# Patient Record
Sex: Male | Born: 1970
Health system: Southern US, Community
[De-identification: ages and names within clinical notes are randomized; demographics above are authoritative.]

## PROBLEM LIST (undated history)

## (undated) HISTORY — PX: HERNIA REPAIR: SHX51

---

## 1997-07-27 ENCOUNTER — Encounter: Admission: RE | Admit: 1997-07-27 | Discharge: 1997-07-27 | Payer: Self-pay | Admitting: Family Medicine

## 1999-07-28 ENCOUNTER — Emergency Department (HOSPITAL_COMMUNITY): Admission: EM | Admit: 1999-07-28 | Discharge: 1999-07-28 | Payer: Self-pay | Admitting: Emergency Medicine

## 1999-08-14 ENCOUNTER — Encounter: Admission: RE | Admit: 1999-08-14 | Discharge: 1999-08-14 | Payer: Self-pay | Admitting: Family Medicine

## 2000-07-09 ENCOUNTER — Encounter: Admission: RE | Admit: 2000-07-09 | Discharge: 2000-07-09 | Payer: Self-pay | Admitting: Sports Medicine

## 2000-07-31 ENCOUNTER — Encounter: Admission: RE | Admit: 2000-07-31 | Discharge: 2000-07-31 | Payer: Self-pay | Admitting: *Deleted

## 2001-06-10 ENCOUNTER — Encounter: Admission: RE | Admit: 2001-06-10 | Discharge: 2001-06-10 | Payer: Self-pay | Admitting: Family Medicine

## 2001-06-30 ENCOUNTER — Encounter: Admission: RE | Admit: 2001-06-30 | Discharge: 2001-06-30 | Payer: Self-pay | Admitting: Family Medicine

## 2001-08-16 ENCOUNTER — Ambulatory Visit (HOSPITAL_COMMUNITY): Admission: RE | Admit: 2001-08-16 | Discharge: 2001-08-16 | Payer: Self-pay | Admitting: Neurology

## 2001-08-16 ENCOUNTER — Encounter: Payer: Self-pay | Admitting: Neurology

## 2001-10-03 ENCOUNTER — Encounter: Admission: RE | Admit: 2001-10-03 | Discharge: 2001-10-03 | Payer: Self-pay | Admitting: Family Medicine

## 2003-01-17 ENCOUNTER — Emergency Department (HOSPITAL_COMMUNITY): Admission: AD | Admit: 2003-01-17 | Discharge: 2003-01-18 | Payer: Self-pay | Admitting: Emergency Medicine

## 2003-05-27 ENCOUNTER — Emergency Department (HOSPITAL_COMMUNITY): Admission: EM | Admit: 2003-05-27 | Discharge: 2003-05-27 | Payer: Self-pay | Admitting: Family Medicine

## 2007-12-18 ENCOUNTER — Emergency Department (HOSPITAL_COMMUNITY): Admission: EM | Admit: 2007-12-18 | Discharge: 2007-12-18 | Payer: Self-pay | Admitting: Family Medicine

## 2009-03-23 ENCOUNTER — Emergency Department (HOSPITAL_COMMUNITY): Admission: EM | Admit: 2009-03-23 | Discharge: 2009-03-23 | Payer: Self-pay | Admitting: Family Medicine

## 2012-08-30 ENCOUNTER — Emergency Department (HOSPITAL_COMMUNITY)
Admission: EM | Admit: 2012-08-30 | Discharge: 2012-08-30 | Disposition: A | Discharge status: Home / Self Care | Payer: Self-pay | Source: Home / Self Care | Attending: Family Medicine | Admitting: Family Medicine

## 2012-08-30 ENCOUNTER — Encounter (HOSPITAL_COMMUNITY): Payer: Self-pay | Admitting: *Deleted

## 2012-08-30 DIAGNOSIS — B351 Tinea unguium: Secondary | ICD-10-CM

## 2012-08-30 DIAGNOSIS — L600 Ingrowing nail: Secondary | ICD-10-CM

## 2012-08-30 MED ORDER — HYDROCODONE-ACETAMINOPHEN 5-325 MG PO TABS: 1.0000 | ORAL_TABLET | Freq: Four times a day (QID) | ORAL | Status: DC | PRN

## 2012-08-30 NOTE — ED Notes (Signed)
Pt  Reports  Ingrown     r  Toenail for  sev  Months    He  Reports  He  Has  Been   Using nail  Clippers    Trying  To   Fix the  Problem

## 2012-08-30 NOTE — ED Provider Notes (Signed)
History     CSN: [id number]  Arrival date & time 08/30/12  1150   First MD Initiated Contact with Patient 08/30/12 1246      Chief Complaint  Patient presents with  . Toe Pain    (Consider location/radiation/quality/duration/timing/severity/associated sxs/prior treatment) Patient is a 42 y.o. male presenting with toe pain. The history is provided by the patient.  Toe Pain This is a new problem. The current episode started more than 1 week ago (pt and wife have been cutting on nail for sev mos, sx getting worse.). The problem has been gradually worsening. The symptoms are aggravated by walking.    History reviewed. No pertinent past medical history.  History reviewed. No pertinent past surgical history.  History reviewed. No pertinent family history.  History  Substance Use Topics  . Smoking status: Never Smoker   . Smokeless tobacco: Not on file  . Alcohol Use: No      Review of Systems  Constitutional: Negative.   Musculoskeletal: Positive for gait problem.    Allergies  Review of patient's allergies indicates no known allergies.  Home Medications   Current Outpatient Rx  Name  Route  Sig  Dispense  Refill  . HYDROcodone-acetaminophen (NORCO/VICODIN) 5-325 MG per tablet   Oral   Take 1 tablet by mouth every 6 (six) hours as needed for pain.   10 tablet   0     BP 140/78  Pulse 72  Temp(Src) 98.6 F (37 C) (Oral)  Resp 16  SpO2 100%  Physical Exam  Nursing note and vitals reviewed. Constitutional: He is oriented to person, place, and time. He appears well-developed and well-nourished.  Musculoskeletal: He exhibits tenderness.       Feet:  Neurological: He is alert and oriented to person, place, and time.  Skin: Skin is warm and dry.    ED Course  NAIL REMOVAL Date/Time: 08/30/2012 1:08 PM Performed by: Linna Hoff Authorized by: Bradd Canary D Consent: Verbal consent obtained. Consent given by: patient Location: right foot Location  details: right big toe Anesthesia: local infiltration Local anesthetic: lidocaine 2% without epinephrine Patient sedated: no Preparation: skin prepped with Betadine Amount removed: complete Nail matrix removed: partial Removed nail replaced and anchored: no Dressing: Xeroform gauze Patient tolerance: Patient tolerated the procedure well with no immediate complications. Comments: Granuloma debrided.   (including critical care time)  Labs Reviewed - No data to display No results found.   1. Onychomycosis with ingrown toenail       MDM  Toenail removed        Linna Hoff, MD 08/30/12 1315

## 2012-09-01 NOTE — ED Notes (Signed)
Chart review.

## 2013-10-07 ENCOUNTER — Encounter (HOSPITAL_COMMUNITY): Payer: Self-pay | Admitting: Emergency Medicine

## 2013-10-07 ENCOUNTER — Emergency Department (HOSPITAL_COMMUNITY)
Admission: EM | Admit: 2013-10-07 | Discharge: 2013-10-07 | Disposition: A | Discharge status: Home / Self Care | Payer: BC Managed Care – PPO | Source: Home / Self Care | Attending: Family Medicine | Admitting: Family Medicine

## 2013-10-07 DIAGNOSIS — Z23 Encounter for immunization: Secondary | ICD-10-CM

## 2013-10-07 DIAGNOSIS — L600 Ingrowing nail: Secondary | ICD-10-CM

## 2013-10-07 MED ORDER — TETANUS-DIPHTH-ACELL PERTUSSIS 5-2.5-18.5 LF-MCG/0.5 IM SUSP
INTRAMUSCULAR | Status: AC
  Filled 2013-10-07: qty 0.5

## 2013-10-07 MED ORDER — HYDROCODONE-ACETAMINOPHEN 5-325 MG PO TABS: 1.0000 | ORAL_TABLET | Freq: Four times a day (QID) | ORAL | Status: DC | PRN

## 2013-10-07 MED ORDER — TETANUS-DIPHTH-ACELL PERTUSSIS 5-2.5-18.5 LF-MCG/0.5 IM SUSP
0.5000 mL | Freq: Once | INTRAMUSCULAR | Status: AC
  Administered 2013-10-07: 0.5 mL via INTRAMUSCULAR

## 2013-10-07 MED ORDER — CEPHALEXIN 500 MG PO CAPS: 500.0000 mg | ORAL_CAPSULE | Freq: Three times a day (TID) | ORAL | Status: DC

## 2013-10-07 NOTE — ED Provider Notes (Signed)
CSN: [id number]     Arrival date & time 10/07/13  1618 History   First MD Initiated Contact with Patient 10/07/13 1635     Chief Complaint  Patient presents with  . Toe Pain   (Consider location/radiation/quality/duration/timing/severity/associated sxs/prior Treatment) HPI Comments: 43 year old male presents complaining of ingrown toenail on the medial portion of the right great toe. He has had problems with this in the past, he had the entire nail removed about a year ago due to onychomycosis With ingrown nail. for the past couple of months he has been having worsening pain from an ingrown nail. He also notes that he has been wearing his steel toed boots frequently and wonders if that may be contributing. Pain in the toe is mild to moderate. No other complaints  Patient is a 43 y.o. male presenting with toe pain.  Toe Pain Pertinent negatives include no chest pain, no abdominal pain and no shortness of breath.    History reviewed. No pertinent past medical history. History reviewed. No pertinent past surgical history. History reviewed. No pertinent family history. History  Substance Use Topics  . Smoking status: Never Smoker   . Smokeless tobacco: Not on file  . Alcohol Use: No    Review of Systems  Constitutional: Negative for fever, chills and fatigue.  HENT: Negative for sore throat.   Eyes: Negative for visual disturbance.  Respiratory: Negative for cough and shortness of breath.   Cardiovascular: Negative for chest pain, palpitations and leg swelling.  Gastrointestinal: Negative for nausea, vomiting, abdominal pain, diarrhea and constipation.  Genitourinary: Negative for dysuria, urgency, frequency and hematuria.  Musculoskeletal: Positive for gait problem. Negative for arthralgias, myalgias, neck pain and neck stiffness.       See history of present illness regarding ingrown toenail-medial right great toe  Skin: Negative for rash.  Neurological: Negative for dizziness,  weakness and light-headedness.  All other systems reviewed and are negative.   Allergies  Review of patient's allergies indicates no known allergies.  Home Medications   Prior to Admission medications   Medication Sig Start Date End Date Taking? Authorizing Provider  cephALEXin (KEFLEX) 500 MG capsule Take 1 capsule (500 mg total) by mouth 3 (three) times daily. 10/07/13   Graylon Good, PA-C  HYDROcodone-acetaminophen (NORCO) 5-325 MG per tablet Take 1 tablet by mouth every 6 (six) hours as needed for moderate pain. 10/07/13   Graylon Good, PA-C  HYDROcodone-acetaminophen (NORCO/VICODIN) 5-325 MG per tablet Take 1 tablet by mouth every 6 (six) hours as needed for pain. 08/30/12   Linna Hoff, MD   BP 134/89  Pulse 78  Temp(Src) 98.8 F (37.1 C) (Oral)  Resp 16  Ht 6\' 1"  (1.854 m)  Wt 235 lb (106.595 kg)  BMI 31.01 kg/m2  SpO2 98% Physical Exam  Nursing note and vitals reviewed. Constitutional: He is oriented to person, place, and time. He appears well-developed and well-nourished. No distress.  HENT:  Head: Normocephalic.  Pulmonary/Chest: Effort normal. No respiratory distress.  Musculoskeletal:       Feet:  Neurological: He is alert and oriented to person, place, and time. Coordination normal.  Skin: Skin is warm and dry. No rash noted. He is not diaphoretic.  Psychiatric: He has a normal mood and affect. Judgment normal.    ED Course  NAIL REMOVAL Date/Time: 10/07/2013 8:17 PM Performed by: Autumn Messing, H Authorized by: Autumn Messing, H Risks and benefits: risks, benefits and alternatives were discussed Consent given by: patient Patient understanding: patient  states understanding of the procedure being performed Patient identity confirmed: verbally with patient Time out: Immediately prior to procedure a "time out" was called to verify the correct patient, procedure, equipment, support staff and site/side marked as required. Location: right foot Location  details: right big toe Anesthesia: digital block Local anesthetic: bupivacaine 0.5% without epinephrine Anesthetic total: 10 ml Preparation: skin prepped with Betadine Amount removed: partial Side: radial Wedge excision of skin of nail fold: no Nail bed sutured: no Nail matrix removed: partial Removed nail replaced and anchored: no Dressing: antibiotic ointment and tube gauze Patient tolerance: Patient tolerated the procedure well with no immediate complications. Comments: The ingrown portion of the nail extended about 1 cm past where he had his nail cut, deep into the nail fold   (including critical care time) Labs Review Labs Reviewed - No data to display  Imaging Review No results found.   MDM   1. Ingrown toenail    The nail was excised, bleeding controlled with silver nitrate cautery and direct pressure. Patient will use a postop shoe and stay off the foot for couple days. Keflex for infection prophylaxis, Norco for pain. Followup with podiatry if this recurs.  Meds ordered this encounter  Medications  . Tdap (BOOSTRIX) injection 0.5 mL    Sig:   . HYDROcodone-acetaminophen (NORCO) 5-325 MG per tablet    Sig: Take 1 tablet by mouth every 6 (six) hours as needed for moderate pain.    Dispense:  30 tablet    Refill:  0    Order Specific Question:  Supervising Provider    Answer:  MERRELL, DAVID J [[id number]]  . cephALEXin (KEFLEX) 500 MG capsule    Sig: Take 1 capsule (500 mg total) by mouth 3 (three) times daily.    Dispense:  21 capsule    Refill:  0    Order Specific Question:  Supervising Provider    Answer:  MERRELL, DAVID J [[id number]]     Graylon GoodZachary H Estiben Mizuno, PA-C 10/07/13 2020

## 2013-10-07 NOTE — ED Notes (Signed)
Pt  Reports     painfull  Ingrown toenail             l  Big  Toe           Has  Had  Problem     With that toe  In  Past

## 2013-10-07 NOTE — Discharge Instructions (Signed)
Ingrown Toenail An ingrown toenail occurs when the sharp edge of your toenail grows into the skin. Causes of ingrown toenails include toenails clipped too far back or poorly fitting shoes. Activities involving sudden stops (basketball, tennis) causing "toe jamming" may lead to an ingrown nail. HOME CARE INSTRUCTIONS   Soak the whole foot in warm soapy water for 20 minutes, 3 times per day.  You may lift the edge of the nail away from the sore skin by wedging a small piece of cotton under the corner of the nail. Be careful not to dig (traumatize) and cause more injury to the area.  Wear shoes that fit well. While the ingrown nail is causing problems, sandals may be beneficial.  Trim your toenails regularly and carefully. Cut your toenails straight across, not in a curve. This will prevent injury to the skin at the corners of the toenail.  Keep your feet clean and dry.  Crutches may be helpful early in treatment if walking is painful.  Antibiotics, if prescribed, should be taken as directed.  Return for a wound check in 2 days or as directed.  Only take over-the-counter or prescription medicines for pain, discomfort, or fever as directed by your caregiver. SEEK IMMEDIATE MEDICAL CARE IF:   You have a fever.  You have increasing pain, redness, swelling, or heat at the wound site.  Your toe is not better in 7 days. If conservative treatment is not successful, surgical removal of a portion or all of the nail may be necessary. MAKE SURE YOU:   Understand these instructions.  Will watch your condition.  Will get help right away if you are not doing well or get worse. Document Released: 02/24/2000 Document Revised: 05/21/2011 Document Reviewed: 02/18/2008 [facility] Patient Information 2015 [place], [place]. This information is not intended to replace advice given to you by your health care provider. Make sure you discuss any questions you have with your health care provider.  Toenail  Removal Toenails may need to be removed because of injury, infections, or to correct abnormal growth. A special non-stick bandage will likely be put tightly on your toe to prevent bleeding. Often times a new nail will grow back. Sometimes the new nail may be deformed. Most of the time when a nail is lost, it will gradually heal, but may be sensitive for a long time. HOME CARE INSTRUCTIONS   Keep your foot elevated to relieve pain and swelling. This will require lying in bed or on a couch with the leg on pillows or sitting in a recliner with the leg up. Walking or letting your leg dangle may increase swelling, slow healing, and cause throbbing pain.  Keep your bandage dry and clean.  Change your bandage in 24 hours.  After your bandage is changed, soak your foot in warm, soapy water for 10 to 20 minutes. Do this 3 times per day. This helps reduce pain and swelling. After soaking your foot apply a clean, dry bandage. Change your bandage if it is wet or dirty.  Only take over-the-counter or prescription medicines for pain, discomfort, or fever as directed by your caregiver.  See your caregiver as needed for problems. You might need a tetanus shot now if:  You have no idea when you had the last one.  You have never had a tetanus shot before.  The injured area had dirt in it. If you need a tetanus shot, and you decide not to get one, there is a rare chance of getting tetanus.  Sickness from tetanus can be serious. If you did get a tetanus shot, your arm may swell, get red and warm to the touch at the shot site. This is common and not a problem. SEEK IMMEDIATE MEDICAL CARE IF:   You have increased pain, swelling, redness, warmth, drainage, or bleeding.  You have a fever.  You have swelling that spreads from your toe into your foot. Document Released: 11/25/2002 Document Revised: 05/21/2011 Document Reviewed: 03/08/2008 [facility]Exit[facility] Patient Information 2015 [place]ExitCare, [place]LL[place]. This information is not  intended to replace advice given to you by your health care provider. Make sure you discuss any questions you have with your health care provider.

## 2013-10-07 NOTE — ED Provider Notes (Signed)
Medical screening examination/treatment/procedure(s) were performed by resident physician or non-physician practitioner and as supervising physician I was immediately available for consultation/collaboration.   Barkley BrunsKINDL,Lile Mccurley DOUGLAS MD.   Linna HoffJames D Nautika Cressey, MD 10/07/13 2033

## 2013-10-07 NOTE — ED Notes (Signed)
Pt. Declined wooden shoe.  States he already has one in the car.  Family member went to get it.  PA notified.

## 2013-10-07 NOTE — ED Notes (Signed)
Pt states he has is own post op shoe

## 2013-12-23 ENCOUNTER — Encounter (HOSPITAL_COMMUNITY): Payer: Self-pay | Admitting: Emergency Medicine

## 2013-12-23 ENCOUNTER — Emergency Department (HOSPITAL_COMMUNITY)
Admission: EM | Admit: 2013-12-23 | Discharge: 2013-12-23 | Disposition: A | Discharge status: Home / Self Care | Payer: BC Managed Care – PPO | Source: Home / Self Care | Attending: Emergency Medicine | Admitting: Emergency Medicine

## 2013-12-23 DIAGNOSIS — J0110 Acute frontal sinusitis, unspecified: Secondary | ICD-10-CM

## 2013-12-23 DIAGNOSIS — G43009 Migraine without aura, not intractable, without status migrainosus: Secondary | ICD-10-CM

## 2013-12-23 MED ORDER — METHYLPREDNISOLONE ACETATE 80 MG/ML IJ SUSP
INTRAMUSCULAR | Status: AC
  Filled 2013-12-23: qty 1

## 2013-12-23 MED ORDER — KETOROLAC TROMETHAMINE 60 MG/2ML IM SOLN
INTRAMUSCULAR | Status: AC
  Filled 2013-12-23: qty 2

## 2013-12-23 MED ORDER — METOCLOPRAMIDE HCL 5 MG/ML IJ SOLN
10.0000 mg | Freq: Once | INTRAMUSCULAR | Status: AC
  Administered 2013-12-23: 10 mg via INTRAMUSCULAR

## 2013-12-23 MED ORDER — AMOXICILLIN-POT CLAVULANATE 875-125 MG PO TABS: 1.0000 | ORAL_TABLET | Freq: Two times a day (BID) | ORAL | Status: DC

## 2013-12-23 MED ORDER — METHYLPREDNISOLONE ACETATE 80 MG/ML IJ SUSP
80.0000 mg | Freq: Once | INTRAMUSCULAR | Status: AC
  Administered 2013-12-23: 80 mg via INTRAMUSCULAR

## 2013-12-23 MED ORDER — METOCLOPRAMIDE HCL 5 MG/ML IJ SOLN
INTRAMUSCULAR | Status: AC
  Filled 2013-12-23: qty 2

## 2013-12-23 MED ORDER — KETOROLAC TROMETHAMINE 60 MG/2ML IM SOLN
60.0000 mg | Freq: Once | INTRAMUSCULAR | Status: AC
  Administered 2013-12-23: 60 mg via INTRAMUSCULAR

## 2013-12-23 MED ORDER — FLUTICASONE PROPIONATE 50 MCG/ACT NA SUSP: 2.0000 | Freq: Every day | NASAL | Status: AC

## 2013-12-23 NOTE — ED Notes (Signed)
Pt  Reports      Symptoms  Of  Headache       X   5  Days               Sensitive  To  Light               Pain  In  Frontal        Region               No  History  Of any  Migraine       He  Is  Sitting  Upright on  The  Exam table  Speaking in complete  sentances

## 2013-12-23 NOTE — Discharge Instructions (Signed)
Migraine Headache A migraine headache is an intense, throbbing pain on one or both sides of your head. A migraine can last for 30 minutes to several hours. CAUSES  The exact cause of a migraine headache is not always known. However, a migraine may be caused when nerves in the brain become irritated and release chemicals that cause inflammation. This causes pain. Certain things may also trigger migraines, such as:  Alcohol.  Smoking.  Stress.  Menstruation.  Aged cheeses.  Foods or drinks that contain nitrates, glutamate, aspartame, or tyramine.  Lack of sleep.  Chocolate.  Caffeine.  Hunger.  Physical exertion.  Fatigue.  Medicines used to treat chest pain (nitroglycerine), birth control pills, estrogen, and some blood pressure medicines. SIGNS AND SYMPTOMS  Pain on one or both sides of your head.  Pulsating or throbbing pain.  Severe pain that prevents daily activities.  Pain that is aggravated by any physical activity.  Nausea, vomiting, or both.  Dizziness.  Pain with exposure to bright lights, loud noises, or activity.  General sensitivity to bright lights, loud noises, or smells. Before you get a migraine, you may get warning signs that a migraine is coming (aura). An aura may include:  Seeing flashing lights.  Seeing bright spots, halos, or zigzag lines.  Having tunnel vision or blurred vision.  Having feelings of numbness or tingling.  Having trouble talking.  Having muscle weakness. DIAGNOSIS  A migraine headache is often diagnosed based on:  Symptoms.  Physical exam.  A CT scan or MRI of your head. These imaging tests cannot diagnose migraines, but they can help rule out other causes of headaches. TREATMENT Medicines may be given for pain and nausea. Medicines can also be given to help prevent recurrent migraines.  HOME CARE INSTRUCTIONS  Only take over-the-counter or prescription medicines for pain or discomfort as directed by your  health care provider. The use of long-term narcotics is not recommended.  Lie down in a dark, quiet room when you have a migraine.  Keep a journal to find out what may trigger your migraine headaches. For example, write down:  What you eat and drink.  How much sleep you get.  Any change to your diet or medicines.  Limit alcohol consumption.  Quit smoking if you smoke.  Get 7-9 hours of sleep, or as recommended by your health care provider.  Limit stress.  Keep lights dim if bright lights bother you and make your migraines worse. SEEK IMMEDIATE MEDICAL CARE IF:   Your migraine becomes severe.  You have a fever.  You have a stiff neck.  You have vision loss.  You have muscular weakness or loss of muscle control.  You start losing your balance or have trouble walking.  You feel faint or pass out.  You have severe symptoms that are different from your first symptoms. MAKE SURE YOU:   Understand these instructions.  Will watch your condition.  Will get help right away if you are not doing well or get worse. Document Released: 02/26/2005 Document Revised: 07/13/2013 Document Reviewed: 11/03/2012 Exit[employer] Patient Information 2015 [employer]. This information is not intended to replace advice given to you by your health care provider. Make sure you discuss any questions you have with your health care provider.  Sinusitis Sinusitis is redness, soreness, and inflammation of the paranasal sinuses. Paranasal sinuses are air pockets within the bones of your face (beneath the eyes, the middle of the forehead, or above the eyes). In healthy paranasal sinuses, mucus   is able to drain out, and air is able to circulate through them by way of your nose. However, when your paranasal sinuses are inflamed, mucus and air can become trapped. This can allow bacteria and other germs to grow and cause infection. Sinusitis can develop quickly and last only a short time (acute) or continue  over a long period (chronic). Sinusitis that lasts for more than 12 weeks is considered chronic.  CAUSES  Causes of sinusitis include:  Allergies.  Structural abnormalities, such as displacement of the cartilage that separates your nostrils (deviated septum), which can decrease the air flow through your nose and sinuses and affect sinus drainage.  Functional abnormalities, such as when the small hairs (cilia) that line your sinuses and help remove mucus do not work properly or are not present. SIGNS AND SYMPTOMS  Symptoms of acute and chronic sinusitis are the same. The primary symptoms are pain and pressure around the affected sinuses. Other symptoms include:  Upper toothache.  Earache.  Headache.  Bad breath.  Decreased sense of smell and taste.  A cough, which worsens when you are lying flat.  Fatigue.  Fever.  Thick drainage from your nose, which often is green and may contain pus (purulent).  Swelling and warmth over the affected sinuses. DIAGNOSIS  Your health care provider will perform a physical exam. During the exam, your health care provider may:  Look in your nose for signs of abnormal growths in your nostrils (nasal polyps).  Tap over the affected sinus to check for signs of infection.  View the inside of your sinuses (endoscopy) using an imaging device that has a light attached (endoscope). If your health care provider suspects that you have chronic sinusitis, one or more of the following tests may be recommended:  Allergy tests.  Nasal culture. A sample of mucus is taken from your nose, sent to a lab, and screened for bacteria.  Nasal cytology. A sample of mucus is taken from your nose and examined by your health care provider to determine if your sinusitis is related to an allergy. TREATMENT  Most cases of acute sinusitis are related to a viral infection and will resolve on their own within 10 days. Sometimes medicines are prescribed to help relieve  symptoms (pain medicine, decongestants, nasal steroid sprays, or saline sprays).  However, for sinusitis related to a bacterial infection, your health care provider will prescribe antibiotic medicines. These are medicines that will help kill the bacteria causing the infection.  Rarely, sinusitis is caused by a fungal infection. In theses cases, your health care provider will prescribe antifungal medicine. For some cases of chronic sinusitis, surgery is needed. Generally, these are cases in which sinusitis recurs more than 3 times per year, despite other treatments. HOME CARE INSTRUCTIONS   Drink plenty of water. Water helps thin the mucus so your sinuses can drain more easily.  Use a humidifier.  Inhale steam 3 to 4 times a day (for example, sit in the bathroom with the shower running).  Apply a warm, moist washcloth to your face 3 to 4 times a day, or as directed by your health care provider.  Use saline nasal sprays to help moisten and clean your sinuses.  Take medicines only as directed by your health care provider.  If you were prescribed either an antibiotic or antifungal medicine, finish it all even if you start to feel better. SEEK IMMEDIATE MEDICAL CARE IF:  You have increasing pain or severe headaches.  You have nausea, vomiting,   or drowsiness.  You have swelling around your face.  You have vision problems.  You have a stiff neck.  You have difficulty breathing. MAKE SURE YOU:   Understand these instructions.  Will watch your condition.  Will get help right away if you are not doing well or get worse. Document Released: 02/26/2005 Document Revised: 07/13/2013 Document Reviewed: 03/13/2011 Exit[employer] Patient Information 2015 [employer], [employer]. This information is not intended to replace advice given to you by your health care provider. Make sure you discuss any questions you have with your health care provider.  

## 2013-12-23 NOTE — ED Provider Notes (Signed)
Chief Complaint   Headache   History of Present Illness   Victor Ingram is a 43 year old male who has had a five-day history of a bifrontal headache. This began this past Saturday morning spontaneously. The headaches reach maximum intensity after about an hour. They wax and wane thereafter. He had good relief with Goody's powder and extra strength Tylenol. The pain to be worse with activity and work. The pain was a frontal and throbbing associated with nausea but no vomiting, photophobia, and phonophobia. He also had nasal congestion with yellowish drainage. He denied any diplopia, blurry vision, or visual auras. He had no fever, chills, sore throat, adenopathy, or stiff neck. He denies any paresthesias, muscle weakness, difficulty with speech, ambulation, or dizziness. He denies any head trauma, use of anticoagulants, eye pain, neck injury, jaw claudication, or history of blood clots.  Review of Systems   Other than as noted above, the patient denies any of the following symptoms: Systemic:  No fever, chills, photophobia, or stiff neck. Eye:  No blurred vision, or diplopia. ENT:  No nasal congestion, rhinorrhea, sinus pressure or pain, or sore throat.  No jaw claudication. Neuro:  No paresthesias, loss of consciousness, seizure activity, muscle weakness, trouble with coordination or gait, trouble speaking or swallowing. Psych:  No depression, anxiety or trouble sleeping.  PMFSH   Past medical history, family history, social history, meds, and allergies were reviewed.    Physical Examination    Vital signs:  BP 146/95  Pulse 76  Temp(Src) 99.7 F (37.6 C) (Oral)  Resp 16  Ht 6\' 1"  (1.854 m)  Wt 230 lb (104.327 kg)  BMI 30.35 kg/m2  SpO2 100% General:  Alert and oriented.  In no distress. Eye:  Lids and conjunctivas normal.  PERRL,  Full EOMs.  Fundi benign with normal discs and vessels. There was no papilledema.  ENT:  No cranial or facial tenderness to palpation.  TMs and  canals clear.  Nasal mucosa was normal and uncongested without any drainage. No intra oral lesions, pharynx clear, mucous membranes moist, dentition normal. Neck:  Supple, full ROM, no tenderness to palpation.  No adenopathy or mass. Neuro:  Alert and orented times 3.  Speech was clear, fluent, and appropriate.  Cranial nerves intact. No pronator drift, muscle strength normal. Finger to nose normal.  DTRs were 2+ and symmetrical.Station and gait were normal.  Romberg's sign was normal.  Able to perform tandem gait well. Psych:  Normal affect.  Course in [facility]   The following medications were given:  Medications  ketorolac (TORADOL) injection 60 mg (60 mg Intramuscular Given 12/23/13 1733)  methylPREDNISolone acetate (DEPO-MEDROL) injection 80 mg (80 mg Intramuscular Given 12/23/13 1733)  metoCLOPramide (REGLAN) injection 10 mg (10 mg Intramuscular Given 12/23/13 1733)   Assessment   The primary encounter diagnosis was Migraine without aura and without status migrainosus, not intractable. A diagnosis of Acute frontal sinusitis, recurrence not specified was also pertinent to this visit.  There is no evidence of subarachnoid hemorrhage, meningitis, encephalitis, subdural hematoma, epidural hematoma, acute glaucoma, carotid dissection, temporal arteritis, cavernous sinus thrombosis, carbon monoxide toxicity, or pseudotumor cerebri.    I think this is a migraine type headache brought on by an acute sinus infection.  Plan   1.  Meds:  The following meds were prescribed:   New Prescriptions   AMOXICILLIN-CLAVULANATE (AUGMENTIN) 875-125 MG PER TABLET    Take 1 tablet by mouth 2 (two) times daily.   FLUTICASONE (FLONASE) 50 MCG/ACT  NASAL SPRAY    Place 2 sprays into both nostrils daily.    2.  Patient Education/Counseling:  The patient was given appropriate handouts, self care instructions, and instructed in pain control.    3.  Follow up:  The patient was told to follow up here  if no better in 2 to 3 days, or sooner if becoming worse in any way, and given some red flag symptoms such as fever, worsening pain, persistent vomiting, or new neurological symptoms which would prompt immediate return.       Victor Likesavid C Solae Norling, MD 12/23/13 [phone]

## 2014-07-26 ENCOUNTER — Encounter (HOSPITAL_COMMUNITY): Payer: Self-pay

## 2014-07-26 ENCOUNTER — Emergency Department (INDEPENDENT_AMBULATORY_CARE_PROVIDER_SITE_OTHER)
Admission: EM | Admit: 2014-07-26 | Discharge: 2014-07-26 | Disposition: A | Discharge status: Home / Self Care | Payer: BLUE CROSS/BLUE SHIELD | Source: Home / Self Care | Attending: Family Medicine | Admitting: Family Medicine

## 2014-07-26 DIAGNOSIS — R0981 Nasal congestion: Secondary | ICD-10-CM

## 2014-07-26 MED ORDER — IPRATROPIUM BROMIDE 0.06 % NA SOLN: 2.0000 | Freq: Four times a day (QID) | NASAL | Status: DC

## 2014-07-26 MED ORDER — PSEUDOEPHEDRINE HCL 60 MG PO TABS: 60.0000 mg | ORAL_TABLET | Freq: Every day | ORAL | Status: DC

## 2014-07-26 MED ORDER — LEVOCETIRIZINE DIHYDROCHLORIDE 5 MG PO TABS: 5.0000 mg | ORAL_TABLET | Freq: Every evening | ORAL | Status: DC

## 2014-07-26 NOTE — ED Notes (Signed)
Previous ha of this nature was dx as being "sinus infection"

## 2014-07-26 NOTE — Discharge Instructions (Signed)
General Headache Without Cause A headache is pain or discomfort felt around the head or neck area. The specific cause of a headache may not be found. There are many causes and types of headaches. A few common ones are:  Tension headaches.  Migraine headaches.  Cluster headaches.  Chronic daily headaches. HOME CARE INSTRUCTIONS   Keep all follow-up appointments with your caregiver or any specialist referral.  Only take over-the-counter or prescription medicines for pain or discomfort as directed by your caregiver.  Lie down in a dark, quiet room when you have a headache.  Keep a headache journal to find out what may trigger your migraine headaches. For example, write down:  What you eat and drink.  How much sleep you get.  Any change to your diet or medicines.  Try massage or other relaxation techniques.  Put ice packs or heat on the head and neck. Use these 3 to 4 times per day for 15 to 20 minutes each time, or as needed.  Limit stress.  Sit up straight, and do not tense your muscles.  Quit smoking if you smoke.  Limit alcohol use.  Decrease the amount of caffeine you drink, or stop drinking caffeine.  Eat and sleep on a regular schedule.  Get 7 to 9 hours of sleep, or as recommended by your caregiver.  Keep lights dim if bright lights bother you and make your headaches worse. SEEK MEDICAL CARE IF:   You have problems with the medicines you were prescribed.  Your medicines are not working.  You have a change from the usual headache.  You have nausea or vomiting. SEEK IMMEDIATE MEDICAL CARE IF:   Your headache becomes severe.  You have a fever.  You have a stiff neck.  You have loss of vision.  You have muscular weakness or loss of muscle control.  You start losing your balance or have trouble walking.  You feel faint or pass out.  You have severe symptoms that are different from your first symptoms. MAKE SURE YOU:   Understand these  instructions.  Will watch your condition.  Will get help right away if you are not doing well or get worse. Document Released: 02/26/2005 Document Revised: 05/21/2011 Document Reviewed: 03/14/2011 [facility] Patient Information 2015 [place], [place]. This information is not intended to replace advice given to you by your health care provider. Make sure you discuss any questions you have with your health care provider.  Headaches, Frequently Asked Questions MIGRAINE HEADACHES Q: What is migraine? What causes it? How can I treat it? A: Generally, migraine headaches begin as a dull ache. Then they develop into a constant, throbbing, and pulsating pain. You may experience pain at the temples. You may experience pain at the front or back of one or both sides of the head. The pain is usually accompanied by a combination of:  Nausea.  Vomiting.  Sensitivity to light and noise. Some people (about 15%) experience an aura (see below) before an attack. The cause of migraine is believed to be chemical reactions in the brain. Treatment for migraine may include over-the-counter or prescription medications. It may also include self-help techniques. These include relaxation training and biofeedback.  Q: What is an aura? A: About 15% of people with migraine get an "aura". This is a sign of neurological symptoms that occur before a migraine headache. You may see wavy or jagged lines, dots, or flashing lights. You might experience tunnel vision or blind spots in one or both eyes. The  aura can include visual or auditory hallucinations (something imagined). It may include disruptions in smell (such as strange odors), taste or touch. Other symptoms include: °· Numbness. °· A "pins and needles" sensation. °· Difficulty in recalling or speaking the correct word. °These neurological events may last as long as 60 minutes. These symptoms will fade as the headache begins. °Q: What is a trigger? °A: Certain physical or  environmental factors can lead to or "trigger" a migraine. These include: °· Foods. °· Hormonal changes. °· Weather. °· Stress. °It is important to remember that triggers are different for everyone. To help prevent migraine attacks, you need to figure out which triggers affect you. Keep a headache diary. This is a good way to track triggers. The diary will help you talk to your healthcare professional about your condition. °Q: Does weather affect migraines? °A: Bright sunshine, hot, humid conditions, and drastic changes in barometric pressure may lead to, or "trigger," a migraine attack in some people. But studies have shown that weather does not act as a trigger for everyone with migraines. °Q: What is the link between migraine and hormones? °A: Hormones start and regulate many of your body's functions. Hormones keep your body in balance within a constantly changing environment. The levels of hormones in your body are unbalanced at times. Examples are during menstruation, pregnancy, or menopause. That can lead to a migraine attack. In fact, about three quarters of all women with migraine report that their attacks are related to the menstrual cycle.  °Q: Is there an increased risk of stroke for migraine sufferers? °A: The likelihood of a migraine attack causing a stroke is very remote. That is not to say that migraine sufferers cannot have a stroke associated with their migraines. In persons under age 40, the most common associated factor for stroke is migraine headache. But over the course of a person's normal life span, the occurrence of migraine headache may actually be associated with a reduced risk of dying from cerebrovascular disease due to stroke.  °Q: What are acute medications for migraine? °A: Acute medications are used to treat the pain of the headache after it has started. Examples over-the-counter medications, NSAIDs, ergots, and triptans.  °Q: What are the triptans? °A: Triptans are the newest class  of abortive medications. They are specifically targeted to treat migraine. Triptans are vasoconstrictors. They moderate some chemical reactions in the brain. The triptans work on receptors in your brain. Triptans help to restore the balance of a neurotransmitter called serotonin. Fluctuations in levels of serotonin are thought to be a main cause of migraine.  °Q: Are over-the-counter medications for migraine effective? °A: Over-the-counter, or "OTC," medications may be effective in relieving mild to moderate pain and associated symptoms of migraine. But you should see your caregiver before beginning any treatment regimen for migraine.  °Q: What are preventive medications for migraine? °A: Preventive medications for migraine are sometimes referred to as "prophylactic" treatments. They are used to reduce the frequency, severity, and length of migraine attacks. Examples of preventive medications include antiepileptic medications, antidepressants, beta-blockers, calcium channel blockers, and NSAIDs (nonsteroidal anti-inflammatory drugs). °Q: Why are anticonvulsants used to treat migraine? °A: During the past few years, there has been an increased interest in antiepileptic drugs for the prevention of migraine. They are sometimes referred to as "anticonvulsants". Both epilepsy and migraine may be caused by similar reactions in the brain.  °Q: Why are antidepressants used to treat migraine? °A: Antidepressants are typically used to treat people with   depression. They may reduce migraine frequency by regulating chemical levels, such as serotonin, in the brain.  Q: What alternative therapies are used to treat migraine? A: The term "alternative therapies" is often used to describe treatments considered outside the scope of conventional Western medicine. Examples of alternative therapy include acupuncture, acupressure, and yoga. Another common alternative treatment is herbal therapy. Some herbs are believed to relieve  headache pain. Always discuss alternative therapies with your caregiver before proceeding. Some herbal products contain arsenic and other toxins. TENSION HEADACHES Q: What is a tension-type headache? What causes it? How can I treat it? A: Tension-type headaches occur randomly. They are often the result of temporary stress, anxiety, fatigue, or anger. Symptoms include soreness in your temples, a tightening band-like sensation around your head (a "vice-like" ache). Symptoms can also include a pulling feeling, pressure sensations, and contracting head and neck muscles. The headache begins in your forehead, temples, or the back of your head and neck. Treatment for tension-type headache may include over-the-counter or prescription medications. Treatment may also include self-help techniques such as relaxation training and biofeedback. CLUSTER HEADACHES Q: What is a cluster headache? What causes it? How can I treat it? A: Cluster headache gets its name because the attacks come in groups. The pain arrives with little, if any, warning. It is usually on one side of the head. A tearing or bloodshot eye and a runny nose on the same side of the headache may also accompany the pain. Cluster headaches are believed to be caused by chemical reactions in the brain. They have been described as the most severe and intense of any headache type. Treatment for cluster headache includes prescription medication and oxygen. SINUS HEADACHES Q: What is a sinus headache? What causes it? How can I treat it? A: When a cavity in the bones of the face and skull (a sinus) becomes inflamed, the inflammation will cause localized pain. This condition is usually the result of an allergic reaction, a tumor, or an infection. If your headache is caused by a sinus blockage, such as an infection, you will probably have a fever. An x-ray will confirm a sinus blockage. Your caregiver's treatment might include antibiotics for the infection, as well as  antihistamines or decongestants.  REBOUND HEADACHES Q: What is a rebound headache? What causes it? How can I treat it? A: A pattern of taking acute headache medications too often can lead to a condition known as "rebound headache." A pattern of taking too much headache medication includes taking it more than 2 days per week or in excessive amounts. That means more than the label or a caregiver advises. With rebound headaches, your medications not only stop relieving pain, they actually begin to cause headaches. Doctors treat rebound headache by tapering the medication that is being overused. Sometimes your caregiver will gradually substitute a different type of treatment or medication. Stopping may be a challenge. Regularly overusing a medication increases the potential for serious side effects. Consult a caregiver if you regularly use headache medications more than 2 days per week or more than the label advises. ADDITIONAL QUESTIONS AND ANSWERS Q: What is biofeedback? A: Biofeedback is a self-help treatment. Biofeedback uses special equipment to monitor your body's involuntary physical responses. Biofeedback monitors:  Breathing.  Pulse.  Heart rate.  Temperature.  Muscle tension.  Brain activity. Biofeedback helps you refine and perfect your relaxation exercises. You learn to control the physical responses that are related to stress. Once the technique has been mastered, you  do not need the equipment any more. Q: Are headaches hereditary? A: Four out of five (80%) of people that suffer report a family history of migraine. Scientists are not sure if this is genetic or a family predisposition. Despite the uncertainty, a child has a 50% chance of having migraine if one parent suffers. The child has a 75% chance if both parents suffer.  Q: Can children get headaches? A: By the time they reach high school, most young people have experienced some type of headache. Many safe and effective  approaches or medications can prevent a headache from occurring or stop it after it has begun.  Q: What type of doctor should I see to diagnose and treat my headache? A: Start with your primary caregiver. Discuss his or her experience and approach to headaches. Discuss methods of classification, diagnosis, and treatment. Your caregiver may decide to recommend you to a headache specialist, depending upon your symptoms or other physical conditions. Having diabetes, allergies, etc., may require a more comprehensive and inclusive approach to your headache. The National Headache Foundation will provide, upon request, a list of NHF physician members in your state. Document Released: 03/[facility]09/2005 Document Revised: 05/21/2011 Document Reviewed: 10/27/2007 [facility] Patient Information 2015 [place], [place]. This information is not intended to replace advice given to you by your health care provider. Make sure you discuss any questions you have with your health care provider.  Upper Respiratory Infection, Adult An upper respiratory infection (URI) is also sometimes known as the common cold. The upper respiratory tract includes the nose, sinuses, throat, trachea, and bronchi. Bronchi are the airways leading to the lungs. Most people improve within 1 week, but symptoms can last up to 2 weeks. A residual cough may last even longer.  CAUSES Many different viruses can infect the tissues lining the upper respiratory tract. The tissues become irritated and inflamed and often become very moist. Mucus production is also common. A cold is contagious. You can easily spread the virus to others by oral contact. This includes kissing, sharing a glass, coughing, or sneezing. Touching your mouth or nose and then touching a surface, which is then touched by another person, can also spread the virus. SYMPTOMS  Symptoms typically develop 1 to 3 days after you come in contact with a cold virus. Symptoms vary from person to person. They  may include:  Runny nose.  Sneezing.  Nasal congestion.  Sinus irritation.  Sore throat.  Loss of voice (laryngitis).  Cough.  Fatigue.  Muscle aches.  Loss of appetite.  Headache.  Low-grade fever. DIAGNOSIS  You might diagnose your own cold based on familiar symptoms, since most people get a cold 2 to 3 times a year. Your caregiver can confirm this based on your exam. Most importantly, your caregiver can check that your symptoms are not due to another disease such as strep throat, sinusitis, pneumonia, asthma, or epiglottitis. Blood tests, throat tests, and X-rays are not necessary to diagnose a common cold, but they may sometimes be helpful in excluding other more serious diseases. Your caregiver will decide if any further tests are required. RISKS AND COMPLICATIONS  You may be at risk for a more severe case of the common cold if you smoke cigarettes, have chronic heart disease (such as heart failure) or lung disease (such as asthma), or if you have a weakened immune system. The very young and very old are also at risk for more serious infections. Bacterial sinusitis, middle ear infections, and bacterial pneumonia can complicate  the common cold. The common cold can worsen asthma and chronic obstructive pulmonary disease (COPD). Sometimes, these complications can require emergency medical care and may be life-threatening. PREVENTION  The best way to protect against getting a cold is to practice good hygiene. Avoid oral or hand contact with people with cold symptoms. Wash your hands often if contact occurs. There is no clear evidence that vitamin C, vitamin E, echinacea, or exercise reduces the chance of developing a cold. However, it is always recommended to get plenty of rest and practice good nutrition. TREATMENT  Treatment is directed at relieving symptoms. There is no cure. Antibiotics are not effective, because the infection is caused by a virus, not by bacteria. Treatment may  include:  Increased fluid intake. Sports drinks offer valuable electrolytes, sugars, and fluids.  Breathing heated mist or steam (vaporizer or shower).  Eating chicken soup or other clear broths, and maintaining good nutrition.  Getting plenty of rest.  Using gargles or lozenges for comfort.  Controlling fevers with ibuprofen or acetaminophen as directed by your caregiver.  Increasing usage of your inhaler if you have asthma. Zinc gel and zinc lozenges, taken in the first 24 hours of the common cold, can shorten the duration and lessen the severity of symptoms. Pain medicines may help with fever, muscle aches, and throat pain. A variety of non-prescription medicines are available to treat congestion and runny nose. Your caregiver can make recommendations and may suggest nasal or lung inhalers for other symptoms.  HOME CARE INSTRUCTIONS   Only take over-the-counter or prescription medicines for pain, discomfort, or fever as directed by your caregiver.  Use a warm mist humidifier or inhale steam from a shower to increase air moisture. This may keep secretions moist and make it easier to breathe.  Drink enough water and fluids to keep your urine clear or pale yellow.  Rest as needed.  Return to work when your temperature has returned to normal or as your caregiver advises. You may need to stay home longer to avoid infecting others. You can also use a face mask and careful hand washing to prevent spread of the virus. SEEK MEDICAL CARE IF:   After the first few days, you feel you are getting worse rather than better.  You need your caregiver's advice about medicines to control symptoms.  You develop chills, worsening shortness of breath, or brown or red sputum. These may be signs of pneumonia.  You develop yellow or brown nasal discharge or pain in the face, especially when you bend forward. These may be signs of sinusitis.  You develop a fever, swollen neck glands, pain with  swallowing, or white areas in the back of your throat. These may be signs of strep throat. SEEK IMMEDIATE MEDICAL CARE IF:   You have a fever.  You develop severe or persistent headache, ear pain, sinus pain, or chest pain.  You develop wheezing, a prolonged cough, cough up blood, or have a change in your usual mucus (if you have chronic lung disease).  You develop sore muscles or a stiff neck. Document Released: 08/22/2000 Document Revised: 05/21/2011 Document Reviewed: 06/03/2013 [facility] Patient Information 2015 [place], [place]. This information is not intended to replace advice given to you by your health care provider. Make sure you discuss any questions you have with your health care provider.

## 2014-07-26 NOTE — ED Provider Notes (Signed)
CSN: [id number]     Arrival date & time 07/26/14  1517 History   First MD Initiated Contact with Patient 07/26/14 1625     Chief Complaint  Patient presents with  . Headache   (Consider location/radiation/quality/duration/timing/severity/associated sxs/prior Treatment) Patient is a 44 y.o. male presenting with URI. The history is provided by the patient.  URI Presenting symptoms: congestion   Presenting symptoms: no cough, no ear pain, no facial pain, no fatigue, no fever, no rhinorrhea and no sore throat   Presenting symptoms comment:  +sinus pressure and nasal congestion  Severity:  Mild Onset quality:  Gradual Duration:  24 hours Timing:  Constant Progression:  Unchanged Chronicity:  New Relieved by:  None tried Worsened by:  Nothing tried Ineffective treatments:  None tried Associated symptoms: headaches   Associated symptoms: no arthralgias, no myalgias, no neck pain, no sinus pain, no sneezing, no swollen glands and no wheezing   Risk factors: no immunosuppression     History reviewed. No pertinent past medical history. History reviewed. No pertinent past surgical history. History reviewed. No pertinent family history. History  Substance Use Topics  . Smoking status: Never Smoker   . Smokeless tobacco: Not on file  . Alcohol Use: No    Review of Systems  Constitutional: Negative for fever and fatigue.  HENT: Positive for congestion. Negative for ear pain, rhinorrhea, sneezing and sore throat.   Respiratory: Negative for cough and wheezing.   Musculoskeletal: Negative for myalgias, arthralgias and neck pain.  Neurological: Positive for headaches.  All other systems reviewed and are negative.   Allergies  Review of patient's allergies indicates no known allergies.  Home Medications   Prior to Admission medications   Medication Sig Start Date End Date Taking? Authorizing Provider  amoxicillin-clavulanate (AUGMENTIN) 875-125 MG per tablet Take 1 tablet by mouth  2 (two) times daily. 12/23/13   Reuben Likesavid C Keller, MD  cephALEXin (KEFLEX) 500 MG capsule Take 1 capsule (500 mg total) by mouth 3 (three) times daily. 10/07/13   Adrian BlackwaterZachary H Baker, PA-C  fluticasone (FLONASE) 50 MCG/ACT nasal spray Place 2 sprays into both nostrils daily. 12/23/13   Reuben Likesavid C Keller, MD  HYDROcodone-acetaminophen (NORCO) 5-325 MG per tablet Take 1 tablet by mouth every 6 (six) hours as needed for moderate pain. 10/07/13   Graylon GoodZachary H Baker, PA-C  HYDROcodone-acetaminophen (NORCO/VICODIN) 5-325 MG per tablet Take 1 tablet by mouth every 6 (six) hours as needed for pain. 08/30/12   Linna HoffJames D Kindl, MD  ipratropium (ATROVENT) 0.06 % nasal spray Place 2 sprays into both nostrils 4 (four) times daily. For nasal congestion 07/26/14   Ria ClockJennifer Lee H Candy Leverett, PA  levocetirizine (XYZAL) 5 MG tablet Take 1 tablet (5 mg total) by mouth every evening. 07/26/14   Mathis FareJennifer Lee H Casin Federici, PA  pseudoephedrine (SUDAFED) 60 MG tablet Take 1 tablet (60 mg total) by mouth daily with breakfast. X 7 days 07/26/14   Jess BartersJennifer Lee H Yarden Hillis, PA   BP 126/76 mmHg  Pulse 58  Temp(Src) 98.2 F (36.8 C) (Oral)  Resp 14  SpO2 98% Physical Exam  Constitutional: He is oriented to person, place, and time. He appears well-developed and well-nourished. No distress.  HENT:  Head: Normocephalic and atraumatic.  Right Ear: Hearing, external ear and ear canal normal. No tenderness. No mastoid tenderness. Tympanic membrane is not injected, not erythematous and not retracted. A middle ear effusion is present. No decreased hearing is noted.  Left Ear: No tenderness. No mastoid tenderness. Tympanic membrane  is not injected, not erythematous and not retracted.  No middle ear effusion. No decreased hearing is noted.  Nose: Mucosal edema and rhinorrhea present.  Mouth/Throat: Uvula is midline, oropharynx is clear and moist and mucous membranes are normal. No oral lesions. No trismus in the jaw. No uvula swelling.  Eyes: Conjunctivae and  EOM are normal. No scleral icterus.  Neck: Normal range of motion. Neck supple.  Cardiovascular: Normal rate, regular rhythm and normal heart sounds.   Pulmonary/Chest: Effort normal and breath sounds normal. No respiratory distress. He has no wheezes.  Musculoskeletal: Normal range of motion.  Lymphadenopathy:    He has no cervical adenopathy.  Neurological: He is alert and oriented to person, place, and time.  Skin: Skin is warm and dry. No rash noted. No erythema.  Psychiatric: He has a normal mood and affect. His behavior is normal.  Nursing note and vitals reviewed.   ED Course  Procedures (including critical care time) Labs Review Labs Reviewed - No data to display  Imaging Review No results found.   MDM   1. Nasal congestion   Vitals normal Exam benign Atrovent  xyzal Sudafed PCP follow up     Ria ClockJennifer Lee H Taeden Geller, PA 07/26/14 1747

## 2014-09-03 ENCOUNTER — Other Ambulatory Visit: Payer: Self-pay | Admitting: Occupational Medicine

## 2014-09-03 ENCOUNTER — Ambulatory Visit: Payer: Self-pay

## 2014-09-03 DIAGNOSIS — M25532 Pain in left wrist: Secondary | ICD-10-CM

## 2014-09-03 DIAGNOSIS — M79641 Pain in right hand: Secondary | ICD-10-CM

## 2014-09-03 DIAGNOSIS — M79632 Pain in left forearm: Secondary | ICD-10-CM

## 2016-02-03 IMAGING — CR DG WRIST COMPLETE 3+V*L*
4 series · 4 of 4 positions shown · non-contrast
Comparison: Right hand series from today reported separately.

CLINICAL DATA: 43-year-old male status post blunt trauma 2 days
ago. Pain. Initial encounter.

EXAM:
LEFT WRIST - COMPLETE 3+ VIEW

[view not recorded (1 of 4)]
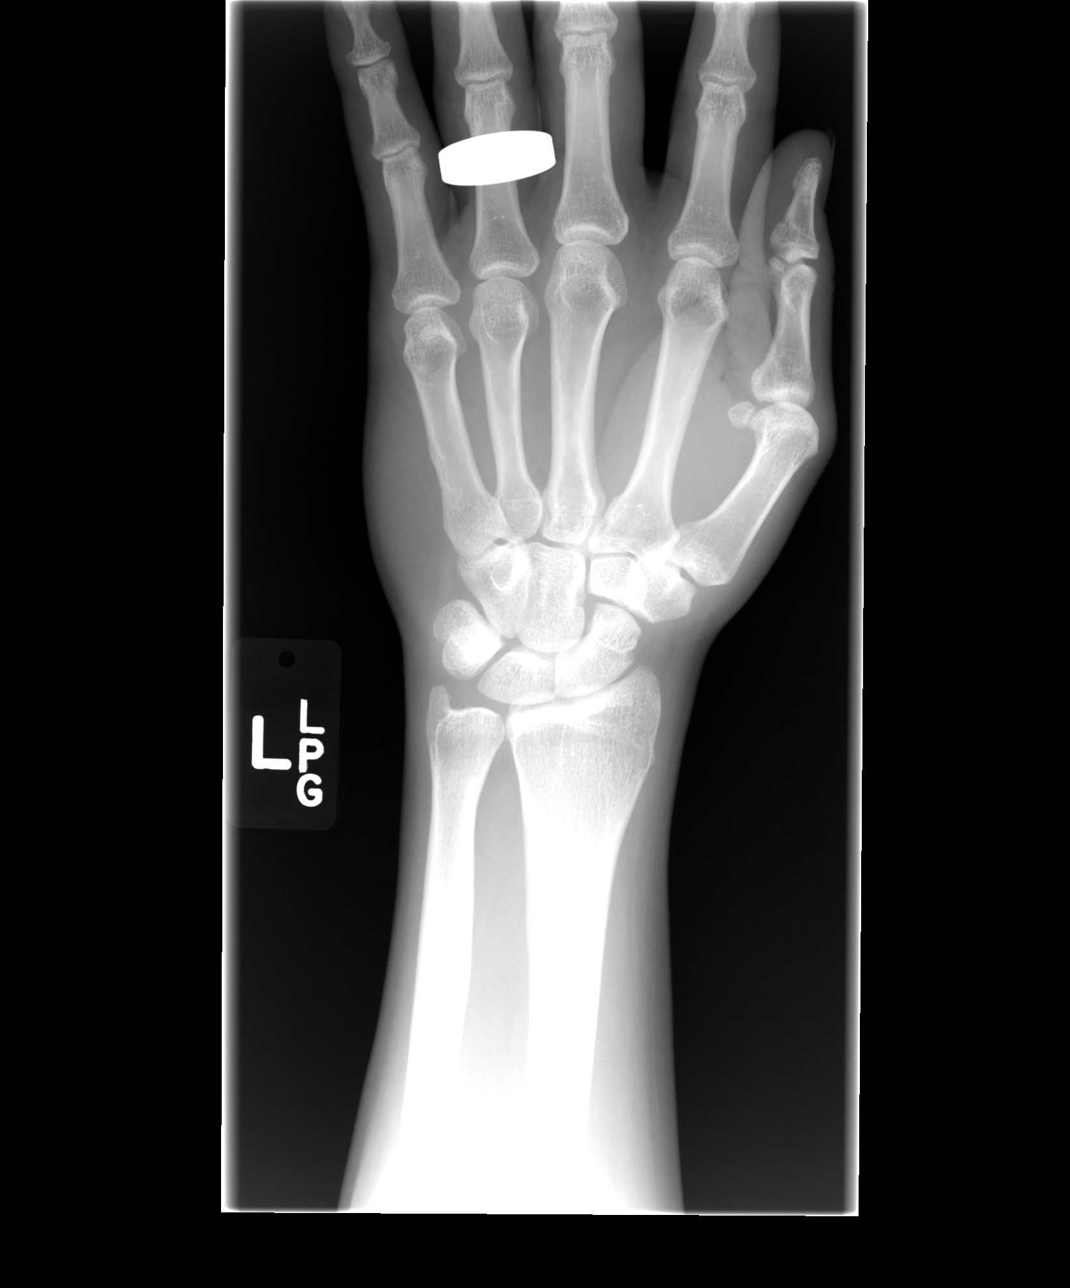

[view not recorded (2 of 4)]
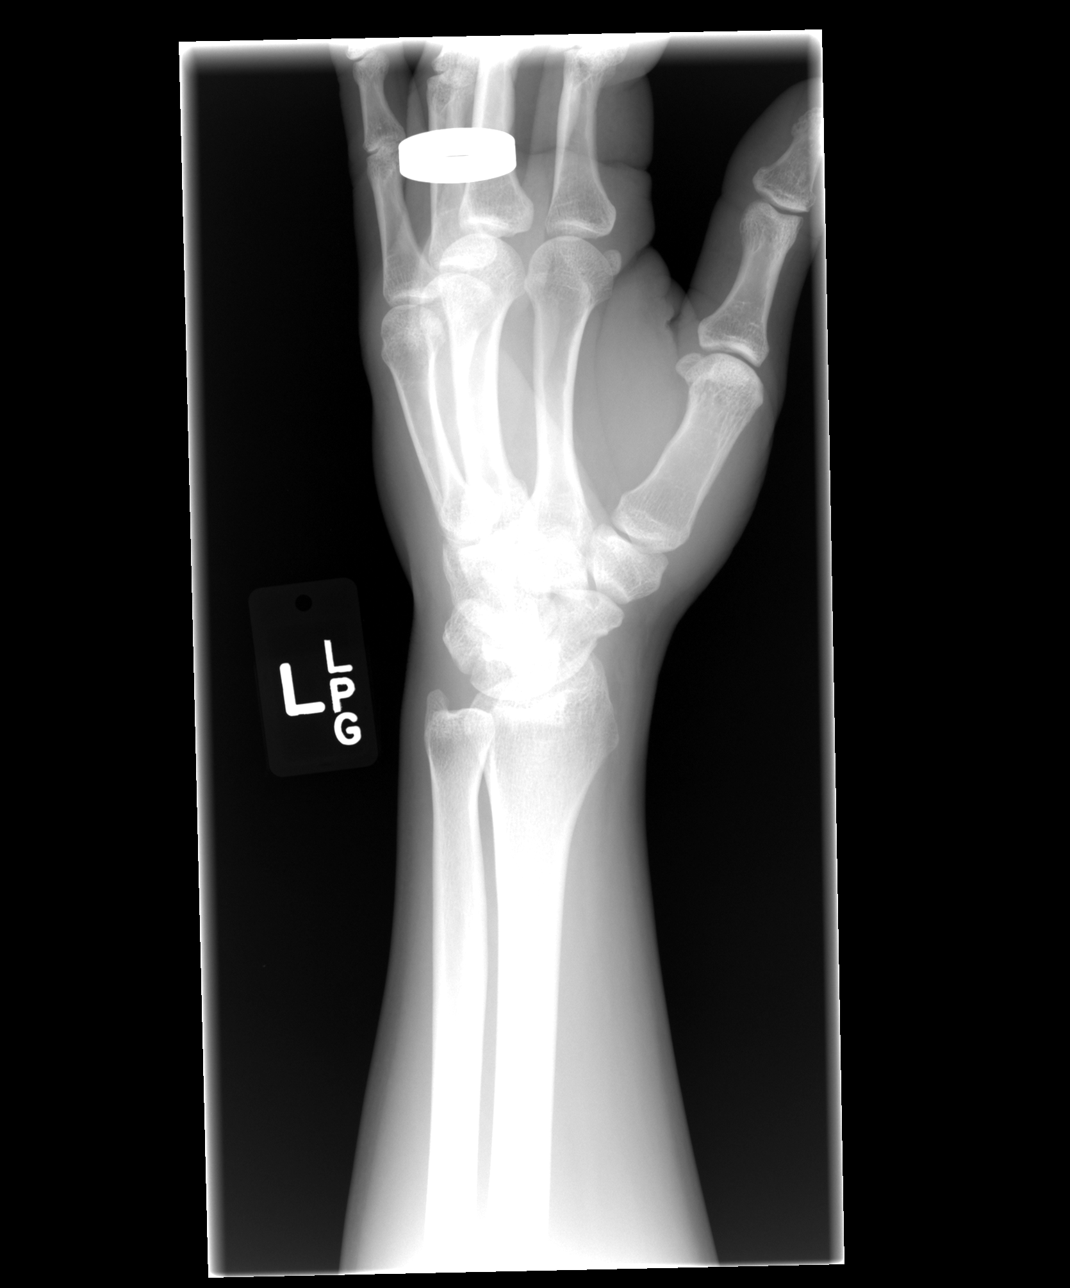

[view not recorded (3 of 4)]
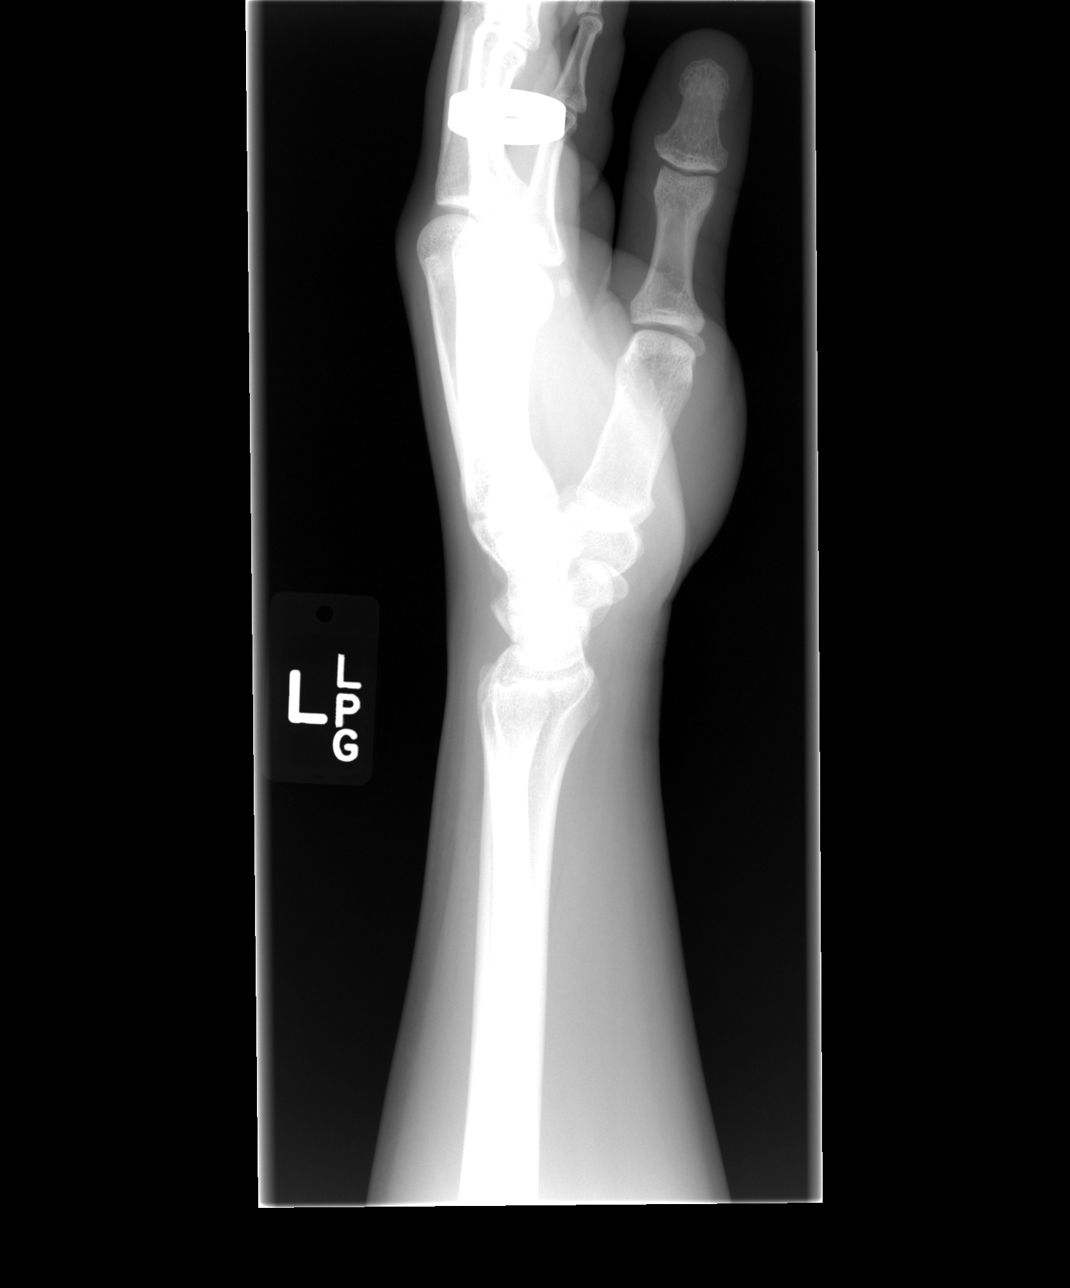

[view not recorded (4 of 4)]
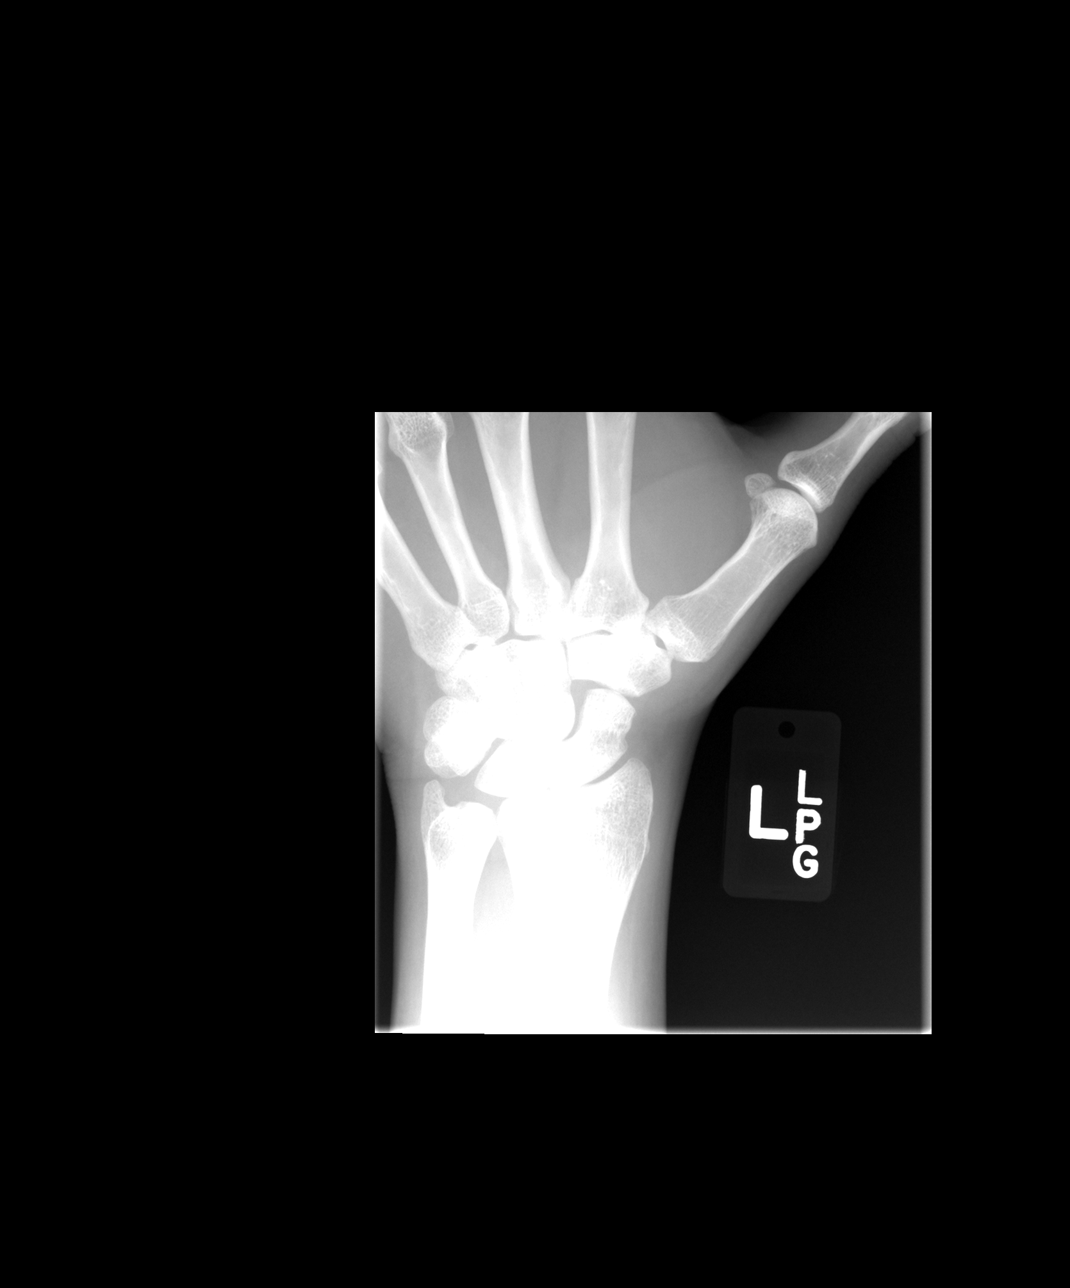

[4 of 4 positions shown; findings below may reference images not displayed]

FINDINGS: Bone mineralization is within normal limits. Distal radius and ulna
appear intact. Carpal bone alignment is normal. The scaphoid appears
intact. Joint spaces within normal limits. Metacarpals appear
intact; there may be a healed prior fracture of the fifth
metacarpal.
IMPRESSION: No acute fracture or dislocation identified about the left wrist.

## 2016-05-07 ENCOUNTER — Encounter (HOSPITAL_COMMUNITY): Payer: Self-pay | Admitting: *Deleted

## 2016-05-07 ENCOUNTER — Emergency Department (HOSPITAL_COMMUNITY)
Admission: EM | Admit: 2016-05-07 | Discharge: 2016-05-07 | Disposition: A | Discharge status: Home / Self Care | Payer: BLUE CROSS/BLUE SHIELD | Attending: Emergency Medicine | Admitting: Emergency Medicine

## 2016-05-07 DIAGNOSIS — S1086XA Insect bite of other specified part of neck, initial encounter: Secondary | ICD-10-CM | POA: Insufficient documentation

## 2016-05-07 DIAGNOSIS — Y939 Activity, unspecified: Secondary | ICD-10-CM | POA: Insufficient documentation

## 2016-05-07 DIAGNOSIS — Y990 Civilian activity done for income or pay: Secondary | ICD-10-CM | POA: Insufficient documentation

## 2016-05-07 DIAGNOSIS — Y929 Unspecified place or not applicable: Secondary | ICD-10-CM | POA: Insufficient documentation

## 2016-05-07 DIAGNOSIS — W57XXXA Bitten or stung by nonvenomous insect and other nonvenomous arthropods, initial encounter: Secondary | ICD-10-CM | POA: Insufficient documentation

## 2016-05-07 MED ORDER — DIPHENHYDRAMINE HCL 25 MG PO TABS: 25.0000 mg | ORAL_TABLET | Freq: Four times a day (QID) | ORAL | 0 refills | Status: DC

## 2016-05-07 NOTE — ED Triage Notes (Signed)
Pt to ED c/o insect bite to R side of neck, reports tingling to area. Quarter size area noted to R side of neck

## 2016-05-07 NOTE — ED Provider Notes (Signed)
[facility]-EMERGENCY DEPT Provider Note   CSN: [id number] Arrival date & time: 05/07/16  1842  By signing my name below, I, Modena Jansky, attest that this documentation has been prepared under the direction and in the presence of non-physician practitioner, Kerrie Buffalo, NP. Electronically Signed: Modena Jansky, Scribe. 05/07/2016. 7:52 PM.  History   Chief Complaint Chief Complaint  Patient presents with  . Insect Bite   The history is provided by the patient. No language interpreter was used.   HPI Comments: Victor Ingram is a 46 y.o. male who presents to the Emergency Department complaining of possible insect bite that occurred last night. He suspects something bit him while he was at work. He noticed gradually worsening swelling this morning so he came to the ED. He used OTC cream without any relief. He describes the area as itching and stinging. His tetanus is UTD. He denies any fever, chills, difficulty swallowing, wheezing, SOB, or other complaints. Patient request work note for work missed.  History reviewed. No pertinent past medical history.  There are no active problems to display for this patient.   History reviewed. No pertinent surgical history.     Home Medications    Prior to Admission medications   Medication Sig Start Date End Date Taking? Authorizing Provider  amoxicillin-clavulanate (AUGMENTIN) 875-125 MG per tablet Take 1 tablet by mouth 2 (two) times daily. 12/23/13   Reuben Likes, MD  cephALEXin (KEFLEX) 500 MG capsule Take 1 capsule (500 mg total) by mouth 3 (three) times daily. 10/07/13   Graylon Good, PA-C  diphenhydrAMINE (BENADRYL) 25 MG tablet Take 1 tablet (25 mg total) by mouth every 6 (six) hours. 05/07/16   Shiquan Mathieu Orlene Och, NP  fluticasone (FLONASE) 50 MCG/ACT nasal spray Place 2 sprays into both nostrils daily. 12/23/13   Reuben Likes, MD  HYDROcodone-acetaminophen (NORCO) 5-325 MG per tablet Take 1 tablet by mouth every 6 (six) hours as needed for  moderate pain. 10/07/13   Graylon Good, PA-C  HYDROcodone-acetaminophen (NORCO/VICODIN) 5-325 MG per tablet Take 1 tablet by mouth every 6 (six) hours as needed for pain. 08/30/12   Linna Hoff, MD  ipratropium (ATROVENT) 0.06 % nasal spray Place 2 sprays into both nostrils 4 (four) times daily. For nasal congestion 07/26/14   Ria Clock, PA  levocetirizine (XYZAL) 5 MG tablet Take 1 tablet (5 mg total) by mouth every evening. 07/26/14   Ria Clock, PA  pseudoephedrine (SUDAFED) 60 MG tablet Take 1 tablet (60 mg total) by mouth daily with breakfast. X 7 days 07/26/14   Ria Clock, PA    Family History No family history on file.  Social History Social History  Substance Use Topics  . Smoking status: Never Smoker  . Smokeless tobacco: Not on file  . Alcohol use No     Allergies   Patient has no known allergies.   Review of Systems Review of Systems  Constitutional: Negative for chills and fever.  HENT: Negative for trouble swallowing.   Respiratory: Negative for shortness of breath and wheezing.   Skin:        insect bite (neck)     Physical Exam Updated Vital Signs BP 124/82 (BP Location: Right Arm)   Pulse 63   Temp 98.3 F (36.8 C) (Oral)   Resp 18   SpO2 100%   Physical Exam  Constitutional: He appears well-developed and well-nourished. No distress.  HENT:  Head: Normocephalic and atraumatic.  Mouth/Throat: Uvula is midline. No posterior oropharyngeal edema or posterior oropharyngeal erythema.  Eyes: Conjunctivae are normal.  Neck: Neck supple.  Cardiovascular: Normal rate and regular rhythm.   Pulmonary/Chest: Effort normal. No respiratory distress. He has no wheezes. He has no rales.  Abdominal: Soft.  Musculoskeletal: Normal range of motion.  Lymphadenopathy:    He has no cervical adenopathy.  Neurological: He is alert.  Skin: Skin is warm and dry.  3 small raised red areas to the right side of the neck consistent with  insect bites. No red streaking, drainage or other signs of infection.   Psychiatric: He has a normal mood and affect.  Nursing note and vitals reviewed.    ED Treatments / Results  DIAGNOSTIC STUDIES: Oxygen Saturation is 100% on RA, normal by my interpretation.    COORDINATION OF CARE: 7:57 PM- Pt advised of plan for treatment and pt agrees.  Labs Radiology No results found.  Procedures Procedures (including critical care time)  Medications Ordered in ED Medications - No data to display   Initial Impression / Assessment and Plan / ED Course  I have reviewed the triage vital signs and the nursing notes.   Final Clinical Impressions(s) / ED Diagnoses  46 y.o. male with itching and red areas to the right side of neck c/w insect bites stable for d/c without respiratory symptoms, no difficulty swallowing, no signs of infection.   Final diagnoses:  Insect bite, initial encounter    New Prescriptions Discharge Medication List as of 05/07/2016  8:03 PM    START taking these medications   Details  diphenhydrAMINE (BENADRYL) 25 MG tablet Take 1 tablet (25 mg total) by mouth every 6 (six) hours., Starting Mon 05/07/2016, Print       I personally performed the services described in this documentation, which was scribed in my presence. The recorded information has been reviewed and is accurate.     [address]Hope Montgomery CityM Kimmora Risenhoover, [place]NP 05/08/16 2156    Cathren LaineKevin Steinl, MD 05/10/16 [phone]

## 2016-05-07 NOTE — Discharge Instructions (Signed)
The benadryl can make you sleepy so do not drive or work while taking it.

## 2016-05-07 NOTE — ED Notes (Signed)
Pt verbalized understanding of d/c instructions and has no further questions. Pt is stable, A&Ox4, VSS.  

## 2019-04-24 ENCOUNTER — Encounter (HOSPITAL_COMMUNITY): Payer: Self-pay | Admitting: Emergency Medicine

## 2019-04-24 ENCOUNTER — Emergency Department (HOSPITAL_COMMUNITY)
Admission: EM | Admit: 2019-04-24 | Discharge: 2019-04-24 | Disposition: A | Discharge status: Home / Self Care | Payer: 59 | Attending: Emergency Medicine | Admitting: Emergency Medicine

## 2019-04-24 ENCOUNTER — Other Ambulatory Visit: Payer: Self-pay

## 2019-04-24 ENCOUNTER — Emergency Department (HOSPITAL_COMMUNITY): Payer: 59

## 2019-04-24 DIAGNOSIS — Z79899 Other long term (current) drug therapy: Secondary | ICD-10-CM | POA: Diagnosis not present

## 2019-04-24 DIAGNOSIS — R0789 Other chest pain: Secondary | ICD-10-CM

## 2019-04-24 LAB — BASIC METABOLIC PANEL
Anion gap: 8 (ref 5–15)
BUN: 14 mg/dL (ref 6–20)
CO2: 27 mmol/L (ref 22–32)
Calcium: 9.1 mg/dL (ref 8.9–10.3)
Chloride: 103 mmol/L (ref 98–111)
Creatinine, Ser: 1.21 mg/dL (ref 0.61–1.24)
GFR calc Af Amer: >60 mL/min (ref >60)
GFR calc non Af Amer: >60 mL/min (ref >60)
Glucose, Bld: 112 mg/dL — ABNORMAL HIGH (ref 70–99)
Potassium: 3.7 mmol/L (ref 3.5–5.1)
Sodium: 138 mmol/L (ref 135–145)

## 2019-04-24 LAB — CBC
HCT: 41.5 % (ref 39.0–52.0)
Hemoglobin: 14 g/dL (ref 13.0–17.0)
MCH: 28.2 pg (ref 26.0–34.0)
MCHC: 33.7 g/dL (ref 30.0–36.0)
MCV: 83.7 fL (ref 80.0–100.0)
Platelets: 289 10*3/uL (ref 150–400)
RBC: 4.96 MIL/uL (ref 4.22–5.81)
RDW: 12 % (ref 11.5–15.5)
WBC: 7 10*3/uL (ref 4.0–10.5)
nRBC: 0 % (ref 0.0–0.2)

## 2019-04-24 LAB — TROPONIN I (HIGH SENSITIVITY)
Troponin I (High Sensitivity): 4 ng/L (ref <18)
Troponin I (High Sensitivity): 3 ng/L (ref <18)

## 2019-04-24 NOTE — ED Provider Notes (Signed)
[facility] EMERGENCY DEPARTMENT Provider Note   CSN: [id number] Arrival date & time: 04/24/19  1713     History Chief Complaint  Patient presents with  . Chest Pain    Victor Ingram is a 49 y.o. male who presents with chest pain.  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  He felt like he would get punched in the chest over the substernal area and it would immediately immediately go away.  Pain lasted for approximately a couple seconds.  Pain kept happening throughout the day so he decided to come to the urgent care today.  He states the pain is much later today.  They did an EKG at urgent care but told him that he should come to the emergency department for further evaluation.  EKG at urgent care was reportedly normal.  He denies any significant cardiac or lung history.  He does not take any medications.  He states that this pain has happened in the past but he has never had it checked out before.  He denies fever, chills, dizziness, current chest pain, shortness of breath, cough, abdominal pain, nausea, vomiting, diaphoresis, leg swelling.  HPI     History reviewed. No pertinent past medical history.  There are no problems to display for this patient.   History reviewed. No pertinent surgical history.     No family history on file.  Social History   Tobacco Use  . Smoking status: Never Smoker  Substance Use Topics  . Alcohol use: No  . Drug use: Not on file    Home Medications Prior to Admission medications   Medication Sig Start Date End Date Taking? Authorizing Provider  amoxicillin-clavulanate (AUGMENTIN) 875-125 MG per tablet Take 1 tablet by mouth 2 (two) times daily. 12/23/13   Reuben Likes, MD  cephALEXin (KEFLEX) 500 MG capsule Take 1 capsule (500 mg total) by mouth 3 (three) times daily. 10/07/13   Excell Seltzer, Adrian Blackwater, PA-C  diphenhydrAMINE  (BENADRYL) 25 MG tablet Take 1 tablet (25 mg total) by mouth every 6 (six) hours. 05/07/16   Janne Napoleon, NP  fluticasone (FLONASE) 50 MCG/ACT nasal spray Place 2 sprays into both nostrils daily. 12/23/13   Reuben Likes, MD  HYDROcodone-acetaminophen (NORCO) 5-325 MG per tablet Take 1 tablet by mouth every 6 (six) hours as needed for moderate pain. 10/07/13   Graylon Good, PA-C  HYDROcodone-acetaminophen (NORCO/VICODIN) 5-325 MG per tablet Take 1 tablet by mouth every 6 (six) hours as needed for pain. 08/30/12   Linna Hoff, MD  ipratropium (ATROVENT) 0.06 % nasal spray Place 2 sprays into both nostrils 4 (four) times daily. For nasal congestion 07/26/14   Presson, Jess Barters H, PA  levocetirizine (XYZAL) 5 MG tablet Take 1 tablet (5 mg total) by mouth every evening. 07/26/14   Presson, Mathis Fare, PA  pseudoephedrine (SUDAFED) 60 MG tablet Take 1 tablet (60 mg total) by mouth daily with breakfast. X 7 days 07/26/14   Ria Clock, PA    Allergies    Patient has no known allergies.  Review of Systems   Review of Systems  Constitutional: Negative for chills and fever.  HENT: Positive for sneezing.   Respiratory: Negative for shortness of breath.   Cardiovascular: Positive for chest pain. Negative for palpitations and leg swelling.  Gastrointestinal: Negative for abdominal pain, nausea and vomiting.  Neurological: Negative for dizziness, syncope and light-headedness.  All other systems reviewed and are negative.   Physical Exam Updated Vital Signs BP (!) 121/101   Pulse 62   Temp 98.2 F (36.8 C) (Oral)   Resp 18   Ht 6\' 1"  (1.854 m)   Wt 108.9 kg   SpO2 100%   BMI 31.66 kg/m   Physical Exam Vitals and nursing note reviewed.  Constitutional:      General: He is not in acute distress.    Appearance: He is well-developed. He is not ill-appearing.     Comments: Calm, cooperative, NAD  HENT:     Head: Normocephalic and atraumatic.  Eyes:     General: No  scleral icterus.       Right eye: No discharge.        Left eye: No discharge.     Conjunctiva/sclera: Conjunctivae normal.     Pupils: Pupils are equal, round, and reactive to light.  Cardiovascular:     Rate and Rhythm: Normal rate and regular rhythm.  Pulmonary:     Effort: Pulmonary effort is normal. No respiratory distress.     Breath sounds: Normal breath sounds.  Abdominal:     General: There is no distension.     Palpations: Abdomen is soft.     Tenderness: There is no abdominal tenderness.  Musculoskeletal:     Cervical back: Normal range of motion.     Right lower leg: No edema.     Left lower leg: No edema.  Skin:    General: Skin is warm and dry.  Neurological:     Mental Status: He is alert and oriented to person, place, and time.  Psychiatric:        Behavior: Behavior normal.     ED Results / Procedures / Treatments   Labs (all labs ordered are listed, but only abnormal results are displayed) Labs Reviewed  BASIC METABOLIC PANEL - Abnormal; Notable for the following components:      Result Value   Glucose, Bld 112 (*)    All other components within normal limits  CBC  TROPONIN I (HIGH SENSITIVITY)  TROPONIN I (HIGH SENSITIVITY)    EKG EKG Interpretation  Date/Time:  Friday April 24 2019 17:17:45 EST Ventricular Rate:  59 PR Interval:  154 QRS Duration: 102 QT Interval:  408 QTC Calculation: 403 R Axis:   76 Text Interpretation: Sinus bradycardia Abnormal QRS-T angle, consider primary T wave abnormality Abnormal ECG Confirmed by [id number] (Marianna Fuss) on 04/24/2019 5:37:21 PM   Radiology DG Chest 2 View  Result Date: 04/24/2019 CLINICAL DATA:  Chest pain EXAM: CHEST - 2 VIEW COMPARISON:  None. FINDINGS: Heart and mediastinal contours are within normal limits. No focal opacities or effusions. No acute bony abnormality. IMPRESSION: Normal study. Electronically Signed   By: 06/22/2019 M.D.   On: 04/24/2019 18:20    Procedures Procedures  (including critical care time)  Medications Ordered in ED Medications - No data to display  ED Course  I have reviewed the triage vital signs and the nursing notes.  Pertinent labs & imaging results that were available during my care of the patient were reviewed by me and considered in my medical decision making (see chart for details).  49 year old male with episodes of chest pain since yesterday. Unclear etiology. Chest pain work up is reassuring. Doubt ACS, PE, pericarditis, esophageal rupture, tension pneumothorax, aortic dissection, cardiac tamponade. EKG is SR with T wave abnormality and  no prior EKG to compare. CXR is negative. Initial and second troponin is 4 and 3 respectively. Labs are unremarkable. No significant past or family hx of cardiac disease. Patient is non-smoker. HEART score is 2. Advised f/u with Cardiology.   MDM Rules/Calculators/A&P                       Final Clinical Impression(s) / ED Diagnoses Final diagnoses:  Atypical chest pain    Rx / DC Orders ED Discharge Orders    None       Recardo Evangelist, PA-C 04/24/19 2110    Lucrezia Starch, MD 04/24/19 2300
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
## 2019-04-24 NOTE — Discharge Instructions (Signed)
Please make a follow up appointment with Cardiology Return to the Emergency Dept if you are worsening

## 2019-05-11 ENCOUNTER — Other Ambulatory Visit: Payer: Self-pay

## 2019-05-11 ENCOUNTER — Encounter: Payer: Self-pay | Admitting: Nurse Practitioner

## 2019-05-11 ENCOUNTER — Ambulatory Visit: Payer: 59 | Admitting: Nurse Practitioner

## 2019-05-11 VITALS — BP 122/80 | HR 78 | Temp 98.5°F | Ht 73.2 in | Wt 243.6 lb

## 2019-05-11 DIAGNOSIS — Z1211 Encounter for screening for malignant neoplasm of colon: Secondary | ICD-10-CM | POA: Diagnosis not present

## 2019-05-11 DIAGNOSIS — Z0000 Encounter for general adult medical examination without abnormal findings: Secondary | ICD-10-CM | POA: Diagnosis not present

## 2019-05-11 DIAGNOSIS — Z125 Encounter for screening for malignant neoplasm of prostate: Secondary | ICD-10-CM | POA: Diagnosis not present

## 2019-05-11 DIAGNOSIS — R9431 Abnormal electrocardiogram [ECG] [EKG]: Secondary | ICD-10-CM

## 2019-05-11 DIAGNOSIS — Z6831 Body mass index (BMI) 31.0-31.9, adult: Secondary | ICD-10-CM

## 2019-05-11 DIAGNOSIS — Z833 Family history of diabetes mellitus: Secondary | ICD-10-CM

## 2019-05-11 DIAGNOSIS — R739 Hyperglycemia, unspecified: Secondary | ICD-10-CM

## 2019-05-11 DIAGNOSIS — E6609 Other obesity due to excess calories: Secondary | ICD-10-CM

## 2019-05-11 NOTE — Patient Instructions (Signed)

## 2019-05-11 NOTE — Progress Notes (Signed)
This visit occurred during the SARS-CoV-2 public health emergency.  Safety protocols were in place, including screening questions prior to the visit, additional usage of staff PPE, and extensive cleaning of exam room while observing appropriate contact time as indicated for disinfecting solutions.  Subjective:     Patient ID: Victor Ingram , male    DOB: Aug 01, 1970 , 49 y.o.   MRN: [id number]   Chief Complaint  Patient presents with  . Establish Care  . Chest Pain    patient went to the ER for chest pain and his ekg came back abnormal     HPI  Here to establish care - referred by his parents.  Has not had a PCP in more than 10 years. 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  He is married with 1 Child.  About 4 years ago was told at high risk for diabetes.   Seen in ER 2 weeks ago for chest pain became concerned went to Urgent care and ER and was referred to cardiology.   3 brothers and 3 sisters all healthy. Father - diabetes and mother - healthy.  One sister with diabetes. One sister with Hypertension.     Men's preventive visit. Patient Health Questionnaire (PHQ-2) is    Office Visit from 05/11/2019 in [facility]  PHQ-2 Total Score  0     Patient is on a regular diet. Marital status: Single. Relevant history for alcohol use is:  Social History   Substance and Sexual Activity  Alcohol Use No   Relevant history for tobacco use is:  Social History   Tobacco Use  Smoking Status Never Smoker  Smokeless Tobacco Never Used  . History reviewed. No pertinent past medical history.   Family History  Problem Relation Age of Onset  . Arthritis Mother   . Diabetes Father   . Healthy Sister   . Healthy Sister   . Healthy Sister   . Healthy Brother   . Healthy Brother   . Healthy Brother   . Stroke Maternal Grandmother   . Congestive Heart Failure Maternal Grandfather   . Cancer Paternal Grandmother   . Hearing loss Paternal Grandfather       Current Outpatient Medications:  .  fluticasone (FLONASE) 50 MCG/ACT nasal spray, Place 2 sprays into both nostrils daily., Disp: 16 g, Rfl: 0 .  ipratropium (ATROVENT) 0.06 % nasal spray, Place 2 sprays into both nostrils 4 (four) times daily. For nasal congestion, Disp: 15 mL, Rfl: 0 .  levocetirizine (XYZAL) 5 MG tablet, Take 1 tablet (5 mg total) by mouth every evening., Disp: 30 tablet, Rfl: 0 .  diphenhydrAMINE (BENADRYL) 25 MG tablet, Take 1 tablet (25 mg total) by mouth every 6 (six) hours. (Patient not taking: Reported on 05/11/2019), Disp: 20 tablet, Rfl: 0   No Known Allergies   Review of Systems  Constitutional: Negative.  Negative for chills and fatigue.  HENT: Negative.   Eyes: Negative.   Respiratory: Negative.   Cardiovascular: Positive for chest pain (no current chest pain).  Gastrointestinal: Negative.   Endocrine: Negative.   Genitourinary: Negative.   Musculoskeletal: Negative.   Skin: Negative.   Neurological: Negative.  Negative for dizziness and headaches.  Hematological: Negative.   Psychiatric/Behavioral: Negative.      Today's Vitals   05/11/19 1520  BP: 122/80  Pulse: 78  Temp: 98.5 F (36.9 C)  TempSrc: Oral  SpO2: 98%  Weight: 243 lb 9.6 oz (110.5 kg)  Height: 6'  1.2" (1.859 m)  PainSc: 0-No pain   Body mass index is 31.96 kg/m.   Objective:  Physical Exam Vitals reviewed.  Constitutional:      General: He is not in acute distress.    Appearance: Normal appearance. He is well-developed. He is obese.  HENT:     Head: Normocephalic and atraumatic.     Right Ear: Tympanic membrane, ear canal and external ear normal. There is no impacted cerumen.     Left Ear: Tympanic membrane, ear canal and external ear normal. There is no impacted cerumen.  Eyes:     Pupils: Pupils are equal, round, and reactive to light.  Neck:     Thyroid: No thyromegaly.  Cardiovascular:     Rate and Rhythm: Normal rate and regular rhythm.     Pulses: Normal  pulses.     Heart sounds: Normal heart sounds. No murmur.  Pulmonary:     Effort: Pulmonary effort is normal. No respiratory distress.     Breath sounds: Normal breath sounds.  Abdominal:     General: Abdomen is flat. Bowel sounds are normal. There is no distension.     Palpations: Abdomen is soft.  Genitourinary:    Prostate: Normal.     Rectum: Guaiac result negative (negative).  Musculoskeletal:        General: Normal range of motion.     Cervical back: Normal range of motion and neck supple.  Skin:    General: Skin is warm.     Capillary Refill: Capillary refill takes less than 2 seconds.  Neurological:     General: No focal deficit present.     Mental Status: He is alert and oriented to person, place, and time.  Psychiatric:        Mood and Affect: Mood normal.        Behavior: Behavior normal.        Thought Content: Thought content normal.        Judgment: Judgment normal.         Assessment And Plan:     1. Health maintenance examination Behavior modifications discussed and diet history reviewed.   Pt will continue to exercise regularly and modify diet with low GI, plant based foods and decrease intake of processed foods.  Recommend intake of daily multivitamin, Vitamin D, and calcium.  Recommend colonoscopy if his insurance approves, guaic negative for preventive screenings, as well as recommend immunizations that include influenza, TDAP - Lipid panel - Liver Profile  2. Encounter for prostate cancer screening  - PSA  3. Abnormal EKG  Had an EKG done at the urgent care which was abnormal, showed abnormal QRS and t wave  Recommended by ER to be referred to cardiology for further evaluation.  4. Family history of diabetes mellitus   5. Elevated blood sugar  Will check for prediabetes/diabetes since has elevated blood sugar and family history of diabetes - Hemoglobin A1c   Minette Brine, FNP    THE PATIENT IS ENCOURAGED TO PRACTICE SOCIAL DISTANCING  DUE TO THE COVID-19 PANDEMIC.

## 2019-05-12 LAB — HEPATIC FUNCTION PANEL
ALT: 13 IU/L (ref 0–44)
AST: 20 IU/L (ref 0–40)
Albumin: 4.6 g/dL (ref 4.0–5.0)
Alkaline Phosphatase: 61 IU/L (ref 39–117)
Bilirubin Total: 0.3 mg/dL (ref 0.0–1.2)
Bilirubin, Direct: 0.08 mg/dL (ref 0.00–0.40)
Total Protein: 7.8 g/dL (ref 6.0–8.5)

## 2019-05-12 LAB — LIPID PANEL
Chol/HDL Ratio: 3.6 ratio (ref 0.0–5.0)
Cholesterol, Total: 178 mg/dL (ref 100–199)
HDL: 49 mg/dL (ref >39)
LDL Chol Calc (NIH): 104 mg/dL — ABNORMAL HIGH (ref 0–99)
Triglycerides: 139 mg/dL (ref 0–149)
VLDL Cholesterol Cal: 25 mg/dL (ref 5–40)

## 2019-05-12 LAB — HEMOGLOBIN A1C
Est. average glucose Bld gHb Est-mCnc: 117 mg/dL
Hgb A1c MFr Bld: 5.7 % — ABNORMAL HIGH (ref 4.8–5.6)

## 2019-05-12 LAB — PSA: Prostate Specific Ag, Serum: 0.5 ng/mL (ref 0.0–4.0)

## 2019-06-02 ENCOUNTER — Other Ambulatory Visit: Payer: Self-pay | Admitting: Nurse Practitioner

## 2019-06-02 DIAGNOSIS — R0789 Other chest pain: Secondary | ICD-10-CM

## 2019-06-02 DIAGNOSIS — R9431 Abnormal electrocardiogram [ECG] [EKG]: Secondary | ICD-10-CM

## 2019-06-30 ENCOUNTER — Other Ambulatory Visit: Payer: Self-pay

## 2019-06-30 ENCOUNTER — Encounter: Payer: Self-pay | Admitting: Cardiovascular Disease

## 2019-06-30 ENCOUNTER — Ambulatory Visit: Payer: 59 | Admitting: Cardiovascular Disease

## 2019-06-30 DIAGNOSIS — R0789 Other chest pain: Secondary | ICD-10-CM | POA: Diagnosis not present

## 2019-06-30 HISTORY — DX: Other chest pain: R07.89

## 2019-06-30 NOTE — Patient Instructions (Addendum)
Medication Instructions:  Your physician recommends that you continue on your current medications as directed. Please refer to the Current Medication list given to you today.  Lab Work: NONE   Testing/Procedures: NONE   Follow-Up: AS NEEDED  

## 2019-06-30 NOTE — Progress Notes (Signed)
Cardiology Office Note   Date:  06/30/2019   ID:  Victor Ingram, DOB Oct 15, 1970, MRN [id number]  PCP:  Victor Brine, FNP  Cardiologist:   Victor Latch, MD   No chief complaint on file.    History of Present Illness: Victor Ingram is a 49 y.o. male who is being seen today for the evaluation of chest pain at the request of Victor Ingram, [place].  In February 2021 he had an episode of chest pain that occurred while driving at work.  He felt like someone punched him in his chest.  It became a nagging pain that persisted for several days.  There was no associated shortness of breath, nausea or diaphoresis.  He doesn't get much exercise.  He drives a truck all day.  He has no exertional symptoms when cutting his grass with a push mower or doing other yard work.  In fact, he feels better with exercise.  He has not noted any lower extremity edema, orthopnea, or PND.  He has made significant improvements in his diet.  He limits fried foods.  He mostly bakes his foods at home and does sometimes have fried foods when he eats out.  Overall he is feeling well.  Past Medical History:  Diagnosis Date  . Atypical chest pain 06/30/2019    History reviewed. No pertinent surgical history.   Current Outpatient Medications  Medication Sig Dispense Refill  . fluticasone (FLONASE) 50 MCG/ACT nasal spray Place 2 sprays into both nostrils daily. 16 g 0  . ipratropium (ATROVENT) 0.06 % nasal spray Place 2 sprays into both nostrils 4 (four) times daily. For nasal congestion 15 mL 0  . levocetirizine (XYZAL) 5 MG tablet Take 1 tablet (5 mg total) by mouth every evening. 30 tablet 0   No current facility-administered medications for this visit.    Allergies:   Patient has no known allergies.    Social History:  The patient  reports that he has never smoked. He has never used smokeless tobacco. He reports that he does not drink alcohol or use drugs.   Family History:  The patient's family history  includes Arthritis in his mother; Cancer in his paternal grandmother; Congestive Heart Failure in his maternal grandfather; Diabetes in his father; Healthy in his brother, brother, brother, sister, sister, and sister; Hearing loss in his paternal grandfather; Heart failure in his maternal grandfather; Stroke in his maternal grandmother.    ROS:  Please see the history of present illness.   Otherwise, review of systems are positive for none.   All other systems are reviewed and negative.    PHYSICAL EXAM: VS:  BP 126/74   Pulse 63   Ht 6\' 1"  (1.854 m)   Wt 241 lb (109.3 kg)   SpO2 97%   BMI 31.80 kg/m  , BMI Body mass index is 31.8 kg/m. GENERAL:  Well appearing HEENT:  Pupils equal round and reactive, fundi not visualized, oral mucosa unremarkable NECK:  No jugular venous distention, waveform within normal limits, carotid upstroke brisk and symmetric, no bruits LUNGS:  Clear to auscultation bilaterally HEART:  RRR.  PMI not displaced or sustained,S1 and S2 within normal limits, no S3, no S4, no clicks, no rubs, and calling about an issue with fluency radiology report murmurs ABD:  Flat, positive bowel sounds normal in frequency in pitch, no bruits, no rebound, no guarding, no midline pulsatile mass, no hepatomegaly, no splenomegaly EXT:  2 plus pulses throughout, no edema, no cyanosis  no clubbing SKIN:  No rashes no nodules NEURO:  Cranial nerves II through XII grossly intact, motor grossly intact throughout PSYCH:  Cognitively intact, oriented to person place and time   EKG:  EKG is ordered today. The ekg ordered today demonstrates sinus rhythm.  Rate 63 bpm.  R axis deviation.  Non-specific T wave abnormalities.   Recent Labs: 04/24/2019: BUN 14; Creatinine, Ser 1.21; Hemoglobin 14.0; Platelets 289; Potassium 3.7; Sodium 138 05/11/2019: ALT 13    Lipid Panel    Component Value Date/Time   CHOL 178 05/11/2019 1618   TRIG 139 05/11/2019 1618   HDL 49 05/11/2019 1618   CHOLHDL  3.6 05/11/2019 1618   LDLCALC 104 (H) 05/11/2019 1618      Wt Readings from Last 3 Encounters:  06/30/19 241 lb (109.3 kg)  05/11/19 243 lb 9.6 oz (110.5 kg)  04/24/19 240 lb (108.9 kg)      ASSESSMENT AND PLAN:  # Atypical chest pain: Chest pain was very atypical and has resolved.  Cardiac enzymes were negative.  No ischemia evaluation needed at this time.  We discussed prevention and increasing his exercise to at least 150 minutes weekly.  EKG changes are non-specific.  Given his lack of ischemic symptoms or other risk factors no intervention is needed at this time.   # CV Disease Prevention:  Increase exercise as above and continue to work on diet.  We discussed getting a calcium score given his borderline lipids but he declined.   Current medicines are reviewed at length with the patient today.  The patient does not have concerns regarding medicines.  The following changes have been made:  no change  Labs/ tests ordered today include:  No orders of the defined types were placed in this encounter.    Disposition:   FU with Victor Kizziah C. Duke Salvia, MD, [facility] as needed.     Signed, Victor Milliron C. Duke Salvia, MD, [facility]  06/30/2019 1:49 PM    [facility]

## 2019-07-16 ENCOUNTER — Telehealth (INDEPENDENT_AMBULATORY_CARE_PROVIDER_SITE_OTHER): Payer: 59 | Admitting: Internal Medicine

## 2019-07-16 ENCOUNTER — Telehealth: Payer: Self-pay

## 2019-07-16 ENCOUNTER — Encounter: Payer: Self-pay | Admitting: Internal Medicine

## 2019-07-16 ENCOUNTER — Other Ambulatory Visit: Payer: Self-pay

## 2019-07-16 VITALS — Temp 98.6°F

## 2019-07-16 DIAGNOSIS — J208 Acute bronchitis due to other specified organisms: Secondary | ICD-10-CM

## 2019-07-16 DIAGNOSIS — U071 COVID-19: Secondary | ICD-10-CM

## 2019-07-16 MED ORDER — AZITHROMYCIN 250 MG PO TABS: ORAL_TABLET | ORAL | 0 refills | Status: AC

## 2019-07-16 MED ORDER — ALBUTEROL SULFATE HFA 108 (90 BASE) MCG/ACT IN AERS: 2.0000 | INHALATION_SPRAY | Freq: Four times a day (QID) | RESPIRATORY_TRACT | 0 refills | Status: DC | PRN

## 2019-07-16 NOTE — Patient Instructions (Signed)
COVID-19 COVID-19 is a respiratory infection that is caused by a virus called severe acute respiratory syndrome coronavirus 2 (SARS-CoV-2). The disease is also known as coronavirus disease or novel coronavirus. In some people, the virus may not cause any symptoms. In others, it may cause a serious infection. The infection can get worse quickly and can lead to complications, such as:  Pneumonia, or infection of the lungs.  Acute respiratory distress syndrome or ARDS. This is a condition in which fluid build-up in the lungs prevents the lungs from filling with air and passing oxygen into the blood.  Acute respiratory failure. This is a condition in which there is not enough oxygen passing from the lungs to the body or when carbon dioxide is not passing from the lungs out of the body.  Sepsis or septic shock. This is a serious bodily reaction to an infection.  Blood clotting problems.  Secondary infections due to bacteria or fungus.  Organ failure. This is when your body's organs stop working. The virus that causes COVID-19 is contagious. This means that it can spread from person to person through droplets from coughs and sneezes (respiratory secretions). What are the causes? This illness is caused by a virus. You may catch the virus by:  Breathing in droplets from an infected person. Droplets can be spread by a person breathing, speaking, singing, coughing, or sneezing.  Touching something, like a table or a doorknob, that was exposed to the virus (contaminated) and then touching your mouth, nose, or eyes. What increases the risk? Risk for infection You are more likely to be infected with this virus if you:  Are within 6 feet (2 meters) of a person with COVID-19.  Provide care for or live with a person who is infected with COVID-19.  Spend time in crowded indoor spaces or live in shared housing. Risk for serious illness You are more likely to become seriously ill from the virus if you:   Are 50 years of age or older. The higher your age, the more you are at risk for serious illness.  Live in a nursing home or long-term care facility.  Have cancer.  Have a long-term (chronic) disease such as: ? Chronic lung disease, including chronic obstructive pulmonary disease or asthma. ? A long-term disease that lowers your body's ability to fight infection (immunocompromised). ? Heart disease, including heart failure, a condition in which the arteries that lead to the heart become narrow or blocked (coronary artery disease), a disease which makes the heart muscle thick, weak, or stiff (cardiomyopathy). ? Diabetes. ? Chronic kidney disease. ? Sickle cell disease, a condition in which red blood cells have an abnormal "sickle" shape. ? Liver disease.  Are obese. What are the signs or symptoms? Symptoms of this condition can range from mild to severe. Symptoms may appear any time from 2 to 14 days after being exposed to the virus. They include:  A fever or chills.  A cough.  Difficulty breathing.  Headaches, body aches, or muscle aches.  Runny or stuffy (congested) nose.  A sore throat.  New loss of taste or smell. Some people may also have stomach problems, such as nausea, vomiting, or diarrhea. Other people may not have any symptoms of COVID-19. How is this diagnosed? This condition may be diagnosed based on:  Your signs and symptoms, especially if: ? You live in an area with a COVID-19 outbreak. ? You recently traveled to or from an area where the virus is common. ? You   provide care for or live with a person who was diagnosed with COVID-19. ? You were exposed to a person who was diagnosed with COVID-19.  A physical exam.  Lab tests, which may include: ? Taking a sample of fluid from the back of your nose and throat (nasopharyngeal fluid), your nose, or your throat using a swab. ? A sample of mucus from your lungs (sputum). ? Blood tests.  Imaging tests, which  may include, X-rays, CT scan, or ultrasound. How is this treated? At present, there is no medicine to treat COVID-19. Medicines that treat other diseases are being used on a trial basis to see if they are effective against COVID-19. Your health care provider will talk with you about ways to treat your symptoms. For most people, the infection is mild and can be managed at home with rest, fluids, and over-the-counter medicines. Treatment for a serious infection usually takes places in a hospital intensive care unit (ICU). It may include one or more of the following treatments. These treatments are given until your symptoms improve.  Receiving fluids and medicines through an IV.  Supplemental oxygen. Extra oxygen is given through a tube in the nose, a face mask, or a hood.  Positioning you to lie on your stomach (prone position). This makes it easier for oxygen to get into the lungs.  Continuous positive airway pressure (CPAP) or bi-level positive airway pressure (BPAP) machine. This treatment uses mild air pressure to keep the airways open. A tube that is connected to a motor delivers oxygen to the body.  Ventilator. This treatment moves air into and out of the lungs by using a tube that is placed in your windpipe.  Tracheostomy. This is a procedure to create a hole in the neck so that a breathing tube can be inserted.  Extracorporeal membrane oxygenation (ECMO). This procedure gives the lungs a chance to recover by taking over the functions of the heart and lungs. It supplies oxygen to the body and removes carbon dioxide. Follow these instructions at home: Lifestyle  If you are sick, stay home except to get medical care. Your health care provider will tell you how long to stay home. Call your health care provider before you go for medical care.  Rest at home as told by your health care provider.  Do not use any products that contain nicotine or tobacco, such as cigarettes, e-cigarettes, and  chewing tobacco. If you need help quitting, ask your health care provider.  Return to your normal activities as told by your health care provider. Ask your health care provider what activities are safe for you. General instructions  Take over-the-counter and prescription medicines only as told by your health care provider.  Drink enough fluid to keep your urine pale yellow.  Keep all follow-up visits as told by your health care provider. This is important. How is this prevented?  There is no vaccine to help prevent COVID-19 infection. However, there are steps you can take to protect yourself and others from this virus. To protect yourself:   Do not travel to areas where COVID-19 is a risk. The areas where COVID-19 is reported change often. To identify high-risk areas and travel restrictions, check the CDC travel website: wwwnc.cdc.gov/travel/notices  If you live in, or must travel to, an area where COVID-19 is a risk, take precautions to avoid infection. ? Stay away from people who are sick. ? Wash your hands often with soap and water for 20 seconds. If soap and water   are not available, use an alcohol-based hand sanitizer. ? Avoid touching your mouth, face, eyes, or nose. ? Avoid going out in public, follow guidance from your state and local health authorities. ? If you must go out in public, wear a cloth face covering or face mask. Make sure your mask covers your nose and mouth. ? Avoid crowded indoor spaces. Stay at least 6 feet (2 meters) away from others. ? Disinfect objects and surfaces that are frequently touched every day. This may include:  Counters and tables.  Doorknobs and light switches.  Sinks and faucets.  Electronics, such as phones, remote controls, keyboards, computers, and tablets. To protect others: If you have symptoms of COVID-19, take steps to prevent the virus from spreading to others.  If you think you have a COVID-19 infection, contact your health care  provider right away. Tell your health care team that you think you may have a COVID-19 infection.  Stay home. Leave your house only to seek medical care. Do not use public transport.  Do not travel while you are sick.  Wash your hands often with soap and water for 20 seconds. If soap and water are not available, use alcohol-based hand sanitizer.  Stay away from other members of your household. Let healthy household members care for children and pets, if possible. If you have to care for children or pets, wash your hands often and wear a mask. If possible, stay in your own room, separate from others. Use a different bathroom.  Make sure that all people in your household wash their hands well and often.  Cough or sneeze into a tissue or your sleeve or elbow. Do not cough or sneeze into your hand or into the air.  Wear a cloth face covering or face mask. Make sure your mask covers your nose and mouth. Where to find more information  Centers for Disease Control and Prevention: www.cdc.gov/coronavirus/2019-ncov/index.html  World Health Organization: www.who.int/health-topics/coronavirus Contact a health care provider if:  You live in or have traveled to an area where COVID-19 is a risk and you have symptoms of the infection.  You have had contact with someone who has COVID-19 and you have symptoms of the infection. Get help right away if:  You have trouble breathing.  You have pain or pressure in your chest.  You have confusion.  You have bluish lips and fingernails.  You have difficulty waking from sleep.  You have symptoms that get worse. These symptoms may represent a serious problem that is an emergency. Do not wait to see if the symptoms will go away. Get medical help right away. Call your local emergency services (911 in the [place] Do not drive yourself to the hospital. Let the emergency medical personnel know if you think you have COVID-19. Summary  COVID-19 is a  respiratory infection that is caused by a virus. It is also known as coronavirus disease or novel coronavirus. It can cause serious infections, such as pneumonia, acute respiratory distress syndrome, acute respiratory failure, or sepsis.  The virus that causes COVID-19 is contagious. This means that it can spread from person to person through droplets from breathing, speaking, singing, coughing, or sneezing.  You are more likely to develop a serious illness if you are 50 years of age or older, have a weak immune system, live in a nursing home, or have chronic disease.  There is no medicine to treat COVID-19. Your health care provider will talk with you about ways to treat your symptoms.    Take steps to protect yourself and others from infection. Wash your hands often and disinfect objects and surfaces that are frequently touched every day. Stay away from people who are sick and wear a mask if you are sick. This information is not intended to replace advice given to you by your health care provider. Make sure you discuss any questions you have with your health care provider. Document Revised: 12/26/2018 Document Reviewed: 04/03/2018 Elsevier Patient Education  2020 [employer]  

## 2019-07-16 NOTE — Telephone Encounter (Signed)
.   Pt understands that although there may be some limitations with this type of visit, we will take all precautions to reduce any security or privacy concerns.  Pt understands that this will be treated like an in office visit and we will file with pt's insurance, and there may be a patient responsible charge related to this service.  Pt gives verbal consent to do virtual visit

## 2019-07-18 NOTE — Progress Notes (Signed)
Virtual Visit via Video   This visit type was conducted due to national recommendations for restrictions regarding the COVID-19 Pandemic (e.g. social distancing) in an effort to limit this patient's exposure and mitigate transmission in our community.  Due to his co-morbid illnesses, this patient is at least at moderate risk for complications without adequate follow up.  This format is felt to be most appropriate for this patient at this time.  All issues noted in this document were discussed and addressed.  A limited physical exam was performed with this format.    This visit type was conducted due to national recommendations for restrictions regarding the COVID-19 Pandemic (e.g. social distancing) in an effort to limit this patient's exposure and mitigate transmission in our community.  Patients identity confirmed using two different identifiers.  This format is felt to be most appropriate for this patient at this time.  All issues noted in this document were discussed and addressed.  No physical exam was performed (except for noted visual exam findings with Video Visits).    Date:  07/18/2019   ID:  Cristi Loron, DOB 1970/03/14, MRN [id number]  Patient Location:  Home  Provider location:   Office    Chief Complaint:  "I have COVID"  History of Present Illness:    Victor Ingram is a 49 y.o. male who presents via video conferencing for a telehealth visit today.    The patient does have symptoms concerning for COVID-19 infection (fever, chills, cough, or new shortness of breath).   He presents today for virtual visit. He prefers this method of contact due to COVID-19 pandemic.  He presents today for guidance/treatment for newly diagnosed COVID infection. He reports he was diagnosed on 4/26 after being tested at Urgent care. He initially presented with body aches, diarrhea, wheezing and fatigue. His sx have progressed over the past week. He thinks he may have contracted this at work,  works for [employer] in [employer]. He has yet to receive COVID vaccine. He denies having any worsening shortness of breath. He denies fever, but has had chills. He is concerned because cough is productive of discolored mucus and he continues to wheeze.     Past Medical History:  Diagnosis Date  . Atypical chest pain 06/30/2019   History reviewed. No pertinent surgical history.   No outpatient medications have been marked as taking for the 07/16/19 encounter (Video Visit) with Glendale Chard, MD.     Allergies:   Patient has no known allergies.   Social History   Tobacco Use  . Smoking status: Never Smoker  . Smokeless tobacco: Never Used  Substance Use Topics  . Alcohol use: No  . Drug use: Never     Family Hx: The patient's family history includes Arthritis in his mother; Cancer in his paternal grandmother; Congestive Heart Failure in his maternal grandfather; Diabetes in his father; Healthy in his brother, brother, brother, sister, sister, and sister; Hearing loss in his paternal grandfather; Heart failure in his maternal grandfather; Stroke in his maternal grandmother.  ROS:   Please see the history of present illness.    Review of Systems  Constitutional: Positive for chills and malaise/fatigue.  Respiratory: Positive for wheezing.   Cardiovascular: Negative.   Gastrointestinal: Positive for diarrhea.  Musculoskeletal: Positive for myalgias.  Neurological: Negative.   Psychiatric/Behavioral: Negative.     All other systems reviewed and are negative.   Labs/Other Tests and Data Reviewed:    Recent Labs: 04/24/2019: BUN  14; Creatinine, Ser 1.21; Hemoglobin 14.0; Platelets 289; Potassium 3.7; Sodium 138 05/11/2019: ALT 13   Recent Lipid Panel Lab Results  Component Value Date/Time   CHOL 178 05/11/2019 04:18 PM   TRIG 139 05/11/2019 04:18 PM   HDL 49 05/11/2019 04:18 PM   CHOLHDL 3.6 05/11/2019 04:18 PM   LDLCALC 104 (H) 05/11/2019 04:18 PM    Wt Readings from  Last 3 Encounters:  06/30/19 241 lb (109.3 kg)  05/11/19 243 lb 9.6 oz (110.5 kg)  04/24/19 240 lb (108.9 kg)     Exam:    Vital Signs:  Temp 98.6 F (37 C) (Other (Comment))     Physical Exam  Constitutional: He is oriented to person, place, and time and well-developed, well-nourished, and in no distress.  HENT:  Head: Normocephalic and atraumatic.  Pulmonary/Chest: Effort normal.  Able to speak in full sentences without shortness of breath.   Musculoskeletal:     Cervical back: Normal range of motion.  Neurological: He is alert and oriented to person, place, and time.  Psychiatric: Affect normal.  Nursing note and vitals reviewed.   ASSESSMENT & PLAN:     1. Acute bronchitis due to COVID-19 virus  I will send him rx Zpak, albuterol inhaler to use prn. He is encouraged to go to hospital if he develops worsening shortness of breath. He is encouraged to notify me ASAP if he feels his sx are worsening. Encouraged to isolate himself within home, stay away from others. All questions were answered to his satisfaction.     COVID-19 Education: The signs and symptoms of COVID-19 were discussed with the patient and how to seek care for testing (follow up with PCP or arrange E-visit).  The importance of social distancing was discussed today.  Patient Risk:   After full review of this patients clinical status, I feel that they are at least moderate risk at this time.    Medication Adjustments/Labs and Tests Ordered: Current medicines are reviewed at length with the patient today.  Concerns regarding medicines are outlined above.   Tests Ordered: No orders of the defined types were placed in this encounter.   Medication Changes: Meds ordered this encounter  Medications  . azithromycin (ZITHROMAX Z-PAK) 250 MG tablet    Sig: Take 2 tablets (500 mg) on  Day 1,  followed by 1 tablet (250 mg) once daily on Days 2 through 5.    Dispense:  6 each    Refill:  0  . albuterol  (VENTOLIN HFA) 108 (90 Base) MCG/ACT inhaler    Sig: Inhale 2 puffs into the lungs every 6 (six) hours as needed for wheezing or shortness of breath.    Dispense:  18 g    Refill:  0    Disposition:  Follow up prn  Signed, Gwynneth Aliment, MD

## 2020-03-23 ENCOUNTER — Ambulatory Visit: Payer: 59 | Admitting: Nurse Practitioner

## 2020-03-23 ENCOUNTER — Other Ambulatory Visit: Payer: Self-pay

## 2020-03-23 ENCOUNTER — Encounter: Payer: Self-pay | Admitting: Nurse Practitioner

## 2020-03-23 VITALS — BP 128/66 | HR 82 | Temp 98.1°F | Ht 73.0 in | Wt 230.2 lb

## 2020-03-23 DIAGNOSIS — Z6830 Body mass index (BMI) 30.0-30.9, adult: Secondary | ICD-10-CM | POA: Diagnosis not present

## 2020-03-23 DIAGNOSIS — E6609 Other obesity due to excess calories: Secondary | ICD-10-CM

## 2020-03-23 DIAGNOSIS — K420 Umbilical hernia with obstruction, without gangrene: Secondary | ICD-10-CM | POA: Diagnosis not present

## 2020-03-23 NOTE — Progress Notes (Signed)
I,Tianna Badgett,acting as a Neurosurgeon for [employer], NP.,have documented all relevant documentation on the behalf of [employer], NP,as directed by  Charlesetta Ivory, NP while in the presence of Charlesetta Ivory, NP.  This visit occurred during the SARS-CoV-2 public health emergency.  Safety protocols were in place, including screening questions prior to the visit, additional usage of staff PPE, and extensive cleaning of exam room while observing appropriate contact time as indicated for disinfecting solutions.  Subjective:     Patient ID: Victor Ingram , male    DOB: Jan 01, 1971 , 50 y.o.   MRN: [id number]   Chief Complaint  Patient presents with  . Umbilical Hernia    HPI  Patient is here today for hernia. He has had this since birth. This hernia has started to cause discomfort about a month ago. Lifting and driving its been bothering it. Wants to get it repaired.     Past Medical History:  Diagnosis Date  . Atypical chest pain 06/30/2019     Family History  Problem Relation Age of Onset  . Arthritis Mother   . Diabetes Father   . Healthy Sister   . Healthy Sister   . Healthy Sister   . Healthy Brother   . Healthy Brother   . Healthy Brother   . Stroke Maternal Grandmother   . Congestive Heart Failure Maternal Grandfather   . Heart failure Maternal Grandfather   . Cancer Paternal Grandmother   . Hearing loss Paternal Grandfather      Current Outpatient Medications:  .  albuterol (VENTOLIN HFA) 108 (90 Base) MCG/ACT inhaler, Inhale 2 puffs into the lungs every 6 (six) hours as needed for wheezing or shortness of breath., Disp: 18 g, Rfl: 0 .  fluticasone (FLONASE) 50 MCG/ACT nasal spray, Place 2 sprays into both nostrils daily., Disp: 16 g, Rfl: 0 .  levocetirizine (XYZAL) 5 MG tablet, Take 1 tablet (5 mg total) by mouth every evening., Disp: 30 tablet, Rfl: 0   No Known Allergies   Review of Systems  Constitutional: Negative.   Respiratory: Negative.   Negative for shortness of breath.   Cardiovascular: Negative.  Negative for chest pain.  Gastrointestinal: Positive for abdominal pain.       Umbilical hernia   Neurological: Negative.      Today's Vitals   03/23/20 1514  BP: 128/66  Pulse: 82  Temp: 98.1 F (36.7 C)  TempSrc: Oral  Weight: 230 lb 3.2 oz (104.4 kg)  Height: 6\' 1"  (1.854 m)   Body mass index is 30.37 kg/m.  Wt Readings from Last 3 Encounters:  03/23/20 230 lb 3.2 oz (104.4 kg)  06/30/19 241 lb (109.3 kg)  05/11/19 243 lb 9.6 oz (110.5 kg)    Objective:  Physical Exam HENT:     Head: Normocephalic and atraumatic.  Cardiovascular:     Rate and Rhythm: Normal rate and regular rhythm.     Pulses: Normal pulses.     Heart sounds: Normal heart sounds.  Pulmonary:     Effort: Pulmonary effort is normal. No respiratory distress.     Breath sounds: Normal breath sounds. No wheezing.  Abdominal:     Hernia: A hernia is present.     Comments: Bulging umblical hernia   Skin:    General: Skin is warm and dry.     Capillary Refill: Capillary refill takes less than 2 seconds.  Neurological:     Mental Status: He is alert.  Psychiatric:  Mood and Affect: Mood normal.        Behavior: Behavior normal.        Thought Content: Thought content normal.        Judgment: Judgment normal.         Assessment And Plan:     1. Umbilical hernia with obstruction, without gangrene - Ambulatory referral to General Surgery sent  -Educated patient to avoid activities that may cause him increase discomfort or pain.  -Educated patient to wear a belt which may help with the discomfort.  -Educated patient if the hernia bothers him too much to go to the ED or give [place] a call.    2. Class 1 obesity due to excess calories without serious comorbidity with body mass index (BMI) of 30.0 to 30.9 in adult He is encouraged to initially strive for BMI less than 26 to decrease cardiac risk. He is advised to exercise no less than  150 minutes per week.    Staying healthy and adopting a healthy lifestyle for your overall health is important. You should eat 7 or more servings of fruits and vegetables per day. You should drink plenty of water to keep yourself hydrated and your kidneys healthy. This includes about 65-80+ fluid ounces of water. Limit your intake of animal fats especially for elevated cholesterol. Avoid highly processed food and limit your salt intake if you have hypertension. Avoid foods high in saturated/Trans fats. Along with a healthy diet it is also very important to maintain time for yourself to maintain a healthy mental health with low stress levels. You should get atleast 150 min of moderate intensity exercise weekly for a healthy heart. Along with eating right and exercising, aim for at least 7-9 hours of sleep daily.  Eat more whole grains which includes barley, wheat berries, oats, brown rice and whole wheat pasta. Use healthy plant oils which include olive, soy, corn, sunflower and peanut. Limit your caffeine and sugary drinks. Limit your intake of fast foods. Limit milk and dairy products to one or two daily servings.    Patient was given opportunity to ask questions. Patient verbalized understanding of the plan and was able to repeat key elements of the plan. All questions were answered to their satisfaction.  Charlesetta Ivory, NP   I, Charlesetta Ivory, NP, have reviewed all documentation for this visit. The documentation on 03/23/20 for the exam, diagnosis, procedures, and orders are all accurate and complete.  THE PATIENT IS ENCOURAGED TO PRACTICE SOCIAL DISTANCING DUE TO THE COVID-19 PANDEMIC.

## 2020-03-23 NOTE — Patient Instructions (Signed)
Hernia, Adult   A hernia happens when tissue inside your body pushes out through a weak spot in your belly muscles (abdominal wall). This makes a round lump (bulge). The lump may be: In a scar from surgery that was done in your belly (incisional hernia). Near your belly button (umbilical hernia). In your groin (inguinal hernia). Your groin is the area where your leg meets your lower belly (abdomen). This kind of hernia could also be: In your scrotum, if you are male. In folds of skin around your vagina, if you are male. In your upper thigh (femoral hernia). Inside your belly (hiatal hernia). This happens when your stomach slides above the muscle between your belly and your chest (diaphragm). If your hernia is small and it does not cause pain, you may not need treatment. If your hernia is large or it causes pain, you may need surgery. Follow these instructions at home: Activity Avoid stretching or overusing (straining) the muscles near your hernia. Straining can happen when you: Lift something heavy. Poop (have a bowel movement). Do not lift anything that is heavier than 10 lb (4.5 kg), or the limit that you are told, until your doctor says that it is safe. Use the strength of your legs when you lift something heavy. Do not use only your back muscles to lift. General instructions Do these things if told by your doctor so you do not have trouble pooping (constipation): Drink enough fluid to keep your pee (urine) pale yellow. Eat foods that are high in fiber. These include fresh fruits and vegetables, whole grains, and beans. Limit foods that are high in fat and processed sugars. These include foods that are fried or sweet. Take medicine for trouble pooping. When you cough, try to cough gently. You may try to push your hernia in by very gently pressing on it when you are lying down. Do not try to force the bulge back in if it will not push in easily. If you are overweight, work with your  doctor to lose weight safely. Do not use any products that have nicotine or tobacco in them. These include cigarettes and e-cigarettes. If you need help quitting, ask your doctor. If you will be having surgery (hernia repair), watch your hernia for changes in shape, size, or color. Tell your doctor if you see any changes. Take over-the-counter and prescription medicines only as told by your doctor. Keep all follow-up visits as told by your doctor. Contact a doctor if: You get new pain, swelling, or redness near your hernia. You poop fewer times in a week than normal. You have trouble pooping. You have poop (stool) that is more dry than normal. You have poop that is harder or larger than normal. Get help right away if: You have a fever. You have belly pain that gets worse. You feel sick to your stomach (nauseous). You throw up (vomit). Your hernia cannot be pushed in by very gently pressing on it when you are lying down. Do not try to force the bulge back in if it will not push in easily. Your hernia: Changes in shape or size. Changes color. Feels hard or it hurts when you touch it. These symptoms may represent a serious problem that is an emergency. Do not wait to see if the symptoms will go away. Get medical help right away. Call your local emergency services (911 in the [place] Summary A hernia happens when tissue inside your body pushes out through a weak spot in the   belly muscles. This creates a bulge. If your hernia is small and it does not hurt, you may not need treatment. If your hernia is large or it hurts, you may need surgery. If you will be having surgery, watch your hernia for changes in shape, size, or color. Tell your doctor about any changes. This information is not intended to replace advice given to you by your health care provider. Make sure you discuss any questions you have with your health care provider. Document Revised: 06/19/2018 Document Reviewed:  11/28/2016 Elsevier Patient Education  2021 Elsevier Inc.  

## 2020-05-11 ENCOUNTER — Encounter: Payer: Self-pay | Admitting: Nurse Practitioner

## 2021-02-07 ENCOUNTER — Encounter: Payer: Self-pay | Admitting: Nurse Practitioner

## 2021-02-07 ENCOUNTER — Other Ambulatory Visit: Payer: Self-pay

## 2021-02-07 ENCOUNTER — Ambulatory Visit (INDEPENDENT_AMBULATORY_CARE_PROVIDER_SITE_OTHER): Payer: 59 | Admitting: Nurse Practitioner

## 2021-02-07 VITALS — BP 124/82 | HR 64 | Temp 98.7°F | Ht 73.0 in

## 2021-02-07 DIAGNOSIS — R0981 Nasal congestion: Secondary | ICD-10-CM | POA: Diagnosis not present

## 2021-02-07 LAB — POC INFLUENZA A&B (BINAX/QUICKVUE)
Influenza A, POC: NEGATIVE
Influenza B, POC: NEGATIVE

## 2021-02-07 LAB — POC COVID19 BINAXNOW: SARS Coronavirus 2 Ag: NEGATIVE

## 2021-02-07 MED ORDER — LEVOCETIRIZINE DIHYDROCHLORIDE 5 MG PO TABS: 5.0000 mg | ORAL_TABLET | Freq: Every evening | ORAL | 1 refills | Status: DC

## 2021-02-07 NOTE — Progress Notes (Signed)
I,Katawbba Wiggins,acting as a Neurosurgeon for [employer], FNP.,have documented all relevant documentation on the behalf of Arnette Felts, FNP,as directed by  Arnette Felts, FNP while in the presence of Arnette Felts, FNP.  This visit occurred during the SARS-CoV-2 public health emergency.  Safety protocols were in place, including screening questions prior to the visit, additional usage of staff PPE, and extensive cleaning of exam room while observing appropriate contact time as indicated for disinfecting solutions.  Subjective:     Patient ID: Victor Ingram , male    DOB: 1970/12/24 , 50 y.o.   MRN: [id number]   Chief Complaint  Patient presents with   URI    HPI  The patient is here today for evaluation of cold symptoms. His son has been sick as well. He has also been fatigued. Patient was seen outside  URI  This is a new problem. The current episode started in the past 7 days (Thursday). There has been no fever. Associated symptoms include congestion, coughing (dry), rhinorrhea and sneezing. Pertinent negatives include no abdominal pain, ear pain, headaches, rash, vomiting or wheezing. He has tried acetaminophen (nettie pot) for the symptoms. The treatment provided mild relief.    Past Medical History:  Diagnosis Date   Atypical chest pain 06/30/2019     Family History  Problem Relation Age of Onset   Arthritis Mother    Diabetes Father    Healthy Sister    Healthy Sister    Healthy Sister    Healthy Brother    Healthy Brother    Healthy Brother    Stroke Maternal Grandmother    Congestive Heart Failure Maternal Grandfather    Heart failure Maternal Grandfather    Cancer Paternal Grandmother    Hearing loss Paternal Grandfather      Current Outpatient Medications:    albuterol (VENTOLIN HFA) 108 (90 Base) MCG/ACT inhaler, Inhale 2 puffs into the lungs every 6 (six) hours as needed for wheezing or shortness of breath., Disp: 18 g, Rfl: 0   fluticasone (FLONASE) 50 MCG/ACT  nasal spray, Place 2 sprays into both nostrils daily., Disp: 16 g, Rfl: 0   levocetirizine (XYZAL) 5 MG tablet, Take 1 tablet (5 mg total) by mouth every evening., Disp: 90 tablet, Rfl: 1   No Known Allergies   Review of Systems  Constitutional:  Positive for fatigue. Negative for chills and fever.  HENT:  Positive for congestion, rhinorrhea and sneezing. Negative for ear pain.   Respiratory:  Positive for cough (dry). Negative for wheezing.   Cardiovascular: Negative.   Gastrointestinal: Negative.  Negative for abdominal pain and vomiting.  Skin:  Negative for rash.  Neurological:  Negative for dizziness and headaches.  Psychiatric/Behavioral: Negative.    All other systems reviewed and are negative.   Today's Vitals   02/07/21 1227  BP: 124/82  Pulse: 64  Temp: 98.7 F (37.1 C)  Height: 6\' 1"  (1.854 m)   Body mass index is 30.37 kg/m.  Wt Readings from Last 3 Encounters:  03/23/20 230 lb 3.2 oz (104.4 kg)  06/30/19 241 lb (109.3 kg)  05/11/19 243 lb 9.6 oz (110.5 kg)    Objective:  Physical Exam Vitals reviewed.  Constitutional:      General: He is not in acute distress.    Appearance: Normal appearance.  HENT:     Head: Normocephalic.  Cardiovascular:     Rate and Rhythm: Normal rate and regular rhythm.     Pulses: Normal pulses.  Heart sounds: Normal heart sounds. No murmur heard. Pulmonary:     Effort: Pulmonary effort is normal. No respiratory distress.     Breath sounds: Normal breath sounds. No wheezing.  Musculoskeletal:     Cervical back: Normal range of motion and neck supple. No tenderness.  Neurological:     General: No focal deficit present.     Mental Status: He is alert and oriented to person, place, and time.     Cranial Nerves: No cranial nerve deficit.     Motor: No weakness.  Psychiatric:        Mood and Affect: Mood normal.        Thought Content: Thought content normal.        Assessment And Plan:     1. Nasal congestion Comments:  Norel AD sample given. Rapid covid is negative, will send for PCR.  Take oscicoccilinium as needed Xyzal Rx sent stop zyrtec - POC COVID-19 - Novel Coronavirus, NAA ([facility]) - POC Influenza A&B(BINAX/QUICKVUE)    Patient was given opportunity to ask questions. Patient verbalized understanding of the plan and was able to repeat key elements of the plan. All questions were answered to their satisfaction.  Arnette Felts, FNP   I, Arnette Felts, FNP, have reviewed all documentation for this visit. The documentation on 02/07/21 for the exam, diagnosis, procedures, and orders are all accurate and complete.   IF YOU HAVE BEEN REFERRED TO A SPECIALIST, IT MAY TAKE 1-2 WEEKS TO SCHEDULE/PROCESS THE REFERRAL. IF YOU HAVE NOT HEARD FROM US/SPECIALIST IN TWO WEEKS, PLEASE GIVE [place] A CALL AT [phone] X [phone].   THE PATIENT IS ENCOURAGED TO PRACTICE SOCIAL DISTANCING DUE TO THE COVID-19 PANDEMIC.

## 2021-02-08 LAB — NOVEL CORONAVIRUS, NAA: SARS-CoV-2, NAA: DETECTED — AB

## 2021-02-08 LAB — SARS-COV-2, NAA 2 DAY TAT

## 2021-02-13 ENCOUNTER — Encounter: Payer: Self-pay | Admitting: Nurse Practitioner

## 2022-10-03 ENCOUNTER — Encounter: Payer: Self-pay | Admitting: Family Medicine

## 2022-10-03 ENCOUNTER — Ambulatory Visit: Payer: 59 | Admitting: Family Medicine

## 2022-10-03 VITALS — BP 130/70 | HR 71 | Temp 98.1°F | Ht 73.0 in | Wt 231.0 lb

## 2022-10-03 DIAGNOSIS — K5909 Other constipation: Secondary | ICD-10-CM | POA: Diagnosis not present

## 2022-10-03 DIAGNOSIS — R1030 Lower abdominal pain, unspecified: Secondary | ICD-10-CM | POA: Diagnosis not present

## 2022-10-03 LAB — POCT URINALYSIS DIPSTICK
Bilirubin, UA: NEGATIVE
Glucose, UA: NEGATIVE
Ketones, UA: NEGATIVE
Leukocytes, UA: NEGATIVE
Nitrite, UA: NEGATIVE
Protein, UA: NEGATIVE
Spec Grav, UA: 1.01
Urobilinogen, UA: 0.2 U/dL
pH, UA: 5.5

## 2022-10-03 MED ORDER — BISACODYL 5 MG PO TBEC
5.0000 mg | DELAYED_RELEASE_TABLET | Freq: Every day | ORAL | 1 refills | Status: AC | PRN
Start: 2022-10-03 — End: 2023-10-03

## 2022-10-03 MED ORDER — ACETAMINOPHEN 500 MG PO TABS
1000.0000 mg | ORAL_TABLET | Freq: Three times a day (TID) | ORAL | 0 refills | Status: AC | PRN
Start: 2022-10-03 — End: 2023-10-03

## 2022-10-03 NOTE — Progress Notes (Signed)
I,Jameka J Llittleton, CMA,acting as a Neurosurgeon for [employer], NP.,have documented all relevant documentation on the behalf of Georges Victorio, NP,as directed by  Benson Porcaro Moshe Salisbury, NP while in the presence of Ashayla Subia, NP.  Subjective:  Patient ID: Victor Ingram , male    DOB: [birth year] , 52 y.o.   MRN: [id number]  Chief Complaint  Patient presents with   Abdominal Pain    HPI  Patient presents today for abdominal pain. He reports he noticed it about 2 weeks ago. The pain is described as sharp and he states the pain radiated down his testicles but subsequently,the pain  comes mainly when he is sitting , driving  and goes down his lower abdomen to his groin area.He is concerned that he may have pulled a muscle. 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  Abdominal Pain This is a new problem. The current episode started 1 to 4 weeks ago. The problem occurs intermittently. The problem has been unchanged. The pain is at a severity of 6/10. The quality of the pain is sharp. The abdominal pain does not radiate. Exacerbated by: when he gets up. The pain is relieved by Nothing. He has tried nothing for the symptoms.     Past Medical History:  Diagnosis Date   Atypical chest pain 06/30/2019     Family History  Problem Relation Age of Onset   Arthritis Mother    Diabetes Father    Healthy Sister    Healthy Sister    Healthy Sister    Healthy Brother    Healthy Brother    Healthy Brother    Stroke Maternal Grandmother    Congestive Heart Failure Maternal Grandfather    Heart failure Maternal Grandfather    Cancer Paternal Grandmother    Hearing loss Paternal Grandfather      Current Outpatient Medications:     acetaminophen (TYLENOL) 500 MG tablet, Take 2 tablets (1,000 mg total) by mouth every 8 (eight) hours as needed for moderate pain., Disp: 100 tablet, Rfl: 0   bisacodyl (DULCOLAX) 5 MG EC tablet, Take 1 tablet (5 mg total) by mouth daily as needed for moderate constipation or mild constipation., Disp: 30 tablet, Rfl: 1   cetirizine (ZYRTEC) 10 MG tablet, Take 10 mg by mouth daily., Disp: , Rfl:    fluticasone (FLONASE) 50 MCG/ACT nasal spray, Place 2 sprays into both nostrils daily., Disp: 16 g, Rfl: 0   No Known Allergies   Review of Systems  Constitutional: Negative.   Eyes: Negative.   Respiratory: Negative.    Gastrointestinal:  Positive for abdominal pain.  Musculoskeletal: Negative.   Skin: Negative.   Psychiatric/Behavioral: Negative.       Today's Vitals   10/03/22 0832  BP: 130/70  Pulse: 71  Temp: 98.1 F (36.7 C)  Weight: 231 lb (104.8 kg)  Height: 6\' 1"  (1.854 m)  PainSc: 6    Body mass index is 30.48 kg/m.  Wt Readings from Last 3 Encounters:  10/03/22 231 lb (104.8 kg)  03/23/20 230 lb 3.2 oz (104.4 kg)  06/30/19 241 lb (109.3 kg)     Objective:  Physical Exam Abdominal:     General: Abdomen is flat. Bowel sounds are normal.  Tenderness: There is abdominal tenderness.  Skin:    General: Skin is warm and dry.  Neurological:     Mental Status: He is alert and oriented to person, place, and time.  Psychiatric:        Mood and Affect: Mood normal.         Assessment And Plan:  Lower abdominal pain -     Ambulatory referral to General Surgery -     POCT urinalysis dipstick -     Acetaminophen; Take 2 tablets (1,000 mg total) by mouth every 8 (eight) hours as needed for moderate pain.  Dispense: 100 tablet; Refill: 0  Other constipation -     Bisacodyl; Take 1 tablet (5 mg total) by mouth daily as needed for moderate constipation or mild constipation.  Dispense: 30 tablet; Refill: 1     Return in 1 month (on 11/03/2022), or if symptoms worsen or  fail to improve, for  physical with Dr Allyne Gee.  Patient was given opportunity to ask questions. Patient verbalized understanding of the plan and was able to repeat key elements of the plan. All questions were answered to their satisfaction.  Casi Westerfeld Moshe Salisbury, NP  I, Damany Eastman Moshe Salisbury, NP, have reviewed all documentation for this visit. The documentation on 10/10/22 for the exam, diagnosis, procedures, and orders are all accurate and complete.   IF YOU HAVE BEEN REFERRED TO A SPECIALIST, IT MAY TAKE 1-2 WEEKS TO SCHEDULE/PROCESS THE REFERRAL. IF YOU HAVE NOT HEARD FROM US/SPECIALIST IN TWO WEEKS, PLEASE GIVE [place] A CALL AT [phone].   THE PATIENT IS ENCOURAGED TO PRACTICE SOCIAL DISTANCING DUE TO THE COVID-19 PANDEMIC.

## 2022-10-10 ENCOUNTER — Encounter: Payer: Self-pay | Admitting: Family Medicine

## 2022-10-10 DIAGNOSIS — R1030 Lower abdominal pain, unspecified: Secondary | ICD-10-CM | POA: Insufficient documentation

## 2022-10-10 DIAGNOSIS — K5909 Other constipation: Secondary | ICD-10-CM | POA: Insufficient documentation

## 2022-10-10 HISTORY — DX: Lower abdominal pain, unspecified: R10.30

## 2023-01-03 ENCOUNTER — Ambulatory Visit (INDEPENDENT_AMBULATORY_CARE_PROVIDER_SITE_OTHER): Payer: 59 | Admitting: Nurse Practitioner

## 2023-01-03 ENCOUNTER — Encounter: Payer: Self-pay | Admitting: Nurse Practitioner

## 2023-01-03 VITALS — BP 110/80 | HR 65 | Temp 98.4°F | Ht 73.0 in | Wt 234.2 lb

## 2023-01-03 DIAGNOSIS — K921 Melena: Secondary | ICD-10-CM

## 2023-01-03 DIAGNOSIS — Z6830 Body mass index (BMI) 30.0-30.9, adult: Secondary | ICD-10-CM

## 2023-01-03 DIAGNOSIS — E6609 Other obesity due to excess calories: Secondary | ICD-10-CM

## 2023-01-03 DIAGNOSIS — Z0000 Encounter for general adult medical examination without abnormal findings: Secondary | ICD-10-CM | POA: Diagnosis not present

## 2023-01-03 DIAGNOSIS — Z125 Encounter for screening for malignant neoplasm of prostate: Secondary | ICD-10-CM

## 2023-01-03 DIAGNOSIS — Z13228 Encounter for screening for other metabolic disorders: Secondary | ICD-10-CM

## 2023-01-03 DIAGNOSIS — E66811 Obesity, class 1: Secondary | ICD-10-CM

## 2023-01-03 DIAGNOSIS — Z13220 Encounter for screening for lipoid disorders: Secondary | ICD-10-CM

## 2023-01-03 DIAGNOSIS — R195 Other fecal abnormalities: Secondary | ICD-10-CM | POA: Diagnosis not present

## 2023-01-03 DIAGNOSIS — Z1211 Encounter for screening for malignant neoplasm of colon: Secondary | ICD-10-CM

## 2023-01-03 DIAGNOSIS — Z2821 Immunization not carried out because of patient refusal: Secondary | ICD-10-CM

## 2023-01-03 LAB — POC HEMOCCULT BLD/STL (OFFICE/1-CARD/DIAGNOSTIC): Fecal Occult Blood, POC: POSITIVE — AB

## 2023-01-03 NOTE — Progress Notes (Signed)
Madelaine Bhat, CMA,acting as a Neurosurgeon for Arnette Felts, FNP.,have documented all relevant documentation on the behalf of Arnette Felts, FNP,as directed by  Arnette Felts, FNP while in the presence of Arnette Felts, FNP.  Subjective:   Patient ID: Victor Ingram , male    DOB: [birth year] , 52 y.o.   MRN: [id number]  Chief Complaint  Patient presents with   Annual Exam    HPI  Patient presents today for HM, Patient reports compliance with medication. Patient denies any chest pain, SOB, or headaches. Patient has no concerns today.   BP Readings from Last 3 Encounters: 01/03/23 : 110/80 10/03/22 : 130/70 02/07/21 : 124/82   Wt Readings from Last 3 Encounters: 01/03/23 : 234 lb 3.2 oz (106.2 kg) 10/03/22 : 231 lb (104.8 kg) 03/23/20 : 230 lb 3.2 oz (104.4 kg)       Past Medical History:  Diagnosis Date   Atypical chest pain 06/30/2019   Lower abdominal pain 10/10/2022     Family History  Problem Relation Age of Onset   Arthritis Mother    Diabetes Father    Healthy Sister    Healthy Sister    Healthy Sister    Healthy Brother    Healthy Brother    Healthy Brother    Stroke Maternal Grandmother    Congestive Heart Failure Maternal Grandfather    Heart failure Maternal Grandfather    Cancer Paternal Grandmother    Hearing loss Paternal Grandfather      Current Outpatient Medications:    acetaminophen (TYLENOL) 500 MG tablet, Take 2 tablets (1,000 mg total) by mouth every 8 (eight) hours as needed for moderate pain., Disp: 100 tablet, Rfl: 0   cetirizine (ZYRTEC) 10 MG tablet, Take 10 mg by mouth daily., Disp: , Rfl:    fluticasone (FLONASE) 50 MCG/ACT nasal spray, Place 2 sprays into both nostrils daily., Disp: 16 g, Rfl: 0   bisacodyl (DULCOLAX) 5 MG EC tablet, Take 1 tablet (5 mg total) by mouth daily as needed for moderate constipation or mild constipation. (Patient not taking: Reported on 01/03/2023), Disp: 30 tablet, Rfl: 1   No Known Allergies   Men's  preventive visit. Patient Health Questionnaire (PHQ-2) is  Flowsheet Row Office Visit from 01/03/2023 in [facility]  PHQ-2 Total Score 0      Patient is on a Regular diet; does admit needs to cut back on carbs. . 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 Marital status: Single. Relevant history for alcohol use is:  Social History   Substance and Sexual Activity  Alcohol Use No  . Relevant history for tobacco use is:  Social History   Tobacco Use  Smoking Status Never  Smokeless Tobacco Never  .   Review of Systems  Constitutional: Negative.   HENT: Negative.    Eyes: Negative.   Respiratory: Negative.    Cardiovascular: Negative.   Gastrointestinal: Negative.   Endocrine: Negative.   Genitourinary: Negative.   Musculoskeletal: Negative.   Skin: Negative.   Allergic/Immunologic: Negative.   Neurological: Negative.   Hematological: Negative.   Psychiatric/Behavioral: Negative.       Today's Vitals   01/03/23 1106  BP: 110/80  Pulse: 65  Temp: 98.4 F (36.9 C)  TempSrc: Oral  Weight: 234 lb 3.2 oz (106.2 kg)  Height: 6\' 1"  (1.854 m)  PainSc: 0-No pain   Body mass index is 30.9 kg/m.  Wt Readings from Last  3 Encounters:  01/03/23 234 lb 3.2 oz (106.2 kg)  10/03/22 231 lb (104.8 kg)  03/23/20 230 lb 3.2 oz (104.4 kg)    Objective:  Physical Exam Vitals reviewed.  Constitutional:      Appearance: Normal appearance. He is obese.  HENT:     Head: Normocephalic and atraumatic.     Right Ear: Tympanic membrane, ear canal and external ear normal. There is no impacted cerumen.     Left Ear: Tympanic membrane, ear canal and external ear normal. There is no impacted cerumen.  Cardiovascular:     Rate and Rhythm: Normal rate and regular rhythm.     Pulses: Normal pulses.     Heart sounds: Normal heart sounds. No murmur heard. Pulmonary:     Effort: Pulmonary effort is normal. No  respiratory distress.     Breath sounds: Normal breath sounds. No wheezing.  Abdominal:     General: Abdomen is flat. Bowel sounds are normal. There is no distension.     Palpations: Abdomen is soft.  Genitourinary:    Prostate: Normal.     Rectum: Guaiac result negative.  Musculoskeletal:        General: Normal range of motion.     Cervical back: Normal range of motion and neck supple.  Skin:    General: Skin is warm.     Capillary Refill: Capillary refill takes less than 2 seconds.  Neurological:     General: No focal deficit present.     Mental Status: He is alert and oriented to person, place, and time.     Cranial Nerves: No cranial nerve deficit.     Motor: No weakness.  Psychiatric:        Mood and Affect: Mood normal.        Behavior: Behavior normal.        Thought Content: Thought content normal.        Judgment: Judgment normal.         Assessment And Plan:    Encounter for annual health examination Assessment & Plan: Behavior modifications discussed and diet history reviewed.   Pt will continue to exercise regularly and modify diet with low GI, plant based foods and decrease intake of processed foods.  Recommend intake of daily multivitamin, Vitamin D, and calcium.  Recommend colonoscopy for preventive screenings, as well as recommend immunizations that include influenza, TDAP, and Shingles   Orders: -     CBC with Differential/Platelet -     CMP14+EGFR  Influenza vaccination declined Assessment & Plan: Patient declined influenza vaccination at this time. Patient is aware that influenza vaccine prevents illness in 70% of healthy people, and reduces hospitalizations to 30-70% in elderly. This vaccine is recommended annually. Education has been provided regarding the importance of this vaccine but patient still declined. Advised may receive this vaccine at local pharmacy or Health Dept.or vaccine clinic. Aware to provide a copy of the vaccination record if  obtained from local pharmacy or Health Dept.  Pt is willing to accept risk associated with refusing vaccination.    COVID-19 vaccination declined Assessment & Plan: Declines covid 19 vaccine. Discussed risk of covid 81 and if he changes her mind about the vaccine to call the office. Education has been provided regarding the importance of this vaccine but patient still declined. Advised may receive this vaccine at local pharmacy or Health Dept.or vaccine clinic. Aware to provide a copy of the vaccination record if obtained from local pharmacy or Health Dept.  Encouraged  to take multivitamin, vitamin d, vitamin c and zinc to increase immune system. Aware can call office if would like to have vaccine here at office. Verbalized acceptance and understanding.    Herpes zoster vaccination declined Assessment & Plan: Declines shingrix, educated on disease process and is aware if he changes his mind to notify office    Encounter for prostate cancer screening -     PSA  Colon cancer screening Assessment & Plan: According to USPTF Colorectal cancer Screening guidelines. Colonoscopy is recommended every 10 years, starting at age 2 years. Will refer to GI for colon cancer screening.   Orders: -     Ambulatory referral to Gastroenterology  Encounter for screening for metabolic disorder -     Hemoglobin A1c  Encounter for screening for lipid disorder -     Lipid panel  Encounter for Hemoccult screening  Occult blood positive stool Assessment & Plan: Positive Hemoccult blood will refer to GI for further evaluation.  Orders: -     POC Hemoccult Bld/Stl (1-Cd Office Dx) -     Ambulatory referral to Gastroenterology  Class 1 obesity due to excess calories without serious comorbidity with body mass index (BMI) of 30.0 to 30.9 in adult Assessment & Plan: He is encouraged to strive for BMI less than 30 to decrease cardiac risk. Advised to aim for at least 150 minutes of exercise per  week.       Return for 1 year physical. Patient was given opportunity to ask questions. Patient verbalized understanding of the plan and was able to repeat key elements of the plan. All questions were answered to their satisfaction.   Arnette Felts, FNP  I, Arnette Felts, FNP, have reviewed all documentation for this visit. The documentation on 01/03/23 for the exam, diagnosis, procedures, and orders are all accurate and complete.

## 2023-01-04 LAB — CMP14+EGFR
ALT: 13 [IU]/L (ref 0–44)
AST: 20 [IU]/L (ref 0–40)
Albumin: 4.5 g/dL (ref 3.8–4.9)
Alkaline Phosphatase: 56 [IU]/L (ref 44–121)
BUN/Creatinine Ratio: 14 (ref 9–20)
BUN: 17 mg/dL (ref 6–24)
Bilirubin Total: 0.4 mg/dL (ref 0.0–1.2)
CO2: 23 mmol/L (ref 20–29)
Calcium: 9.6 mg/dL (ref 8.7–10.2)
Chloride: 102 mmol/L (ref 96–106)
Creatinine, Ser: 1.18 mg/dL (ref 0.76–1.27)
Globulin, Total: 2.9 g/dL (ref 1.5–4.5)
Glucose: 96 mg/dL (ref 70–99)
Potassium: 4.2 mmol/L (ref 3.5–5.2)
Sodium: 138 mmol/L (ref 134–144)
Total Protein: 7.4 g/dL (ref 6.0–8.5)
eGFR: 75 mL/min/{1.73_m2} (ref >59)

## 2023-01-04 LAB — HEMOGLOBIN A1C
Est. average glucose Bld gHb Est-mCnc: 123 mg/dL
Hgb A1c MFr Bld: 5.9 % — ABNORMAL HIGH (ref 4.8–5.6)

## 2023-01-04 LAB — LIPID PANEL
Chol/HDL Ratio: 3.6 ratio (ref 0.0–5.0)
Cholesterol, Total: 193 mg/dL (ref 100–199)
HDL: 54 mg/dL (ref >39)
LDL Chol Calc (NIH): 126 mg/dL — ABNORMAL HIGH (ref 0–99)
Triglycerides: 68 mg/dL (ref 0–149)
VLDL Cholesterol Cal: 13 mg/dL (ref 5–40)

## 2023-01-04 LAB — CBC WITH DIFFERENTIAL/PLATELET
Basophils Absolute: 0.1 10*3/uL (ref 0.0–0.2)
Basos: 2 %
EOS (ABSOLUTE): 0.1 10*3/uL (ref 0.0–0.4)
Eos: 2 %
Hematocrit: 43.6 % (ref 37.5–51.0)
Hemoglobin: 14.2 g/dL (ref 13.0–17.7)
Immature Grans (Abs): 0 10*3/uL (ref 0.0–0.1)
Immature Granulocytes: 0 %
Lymphocytes Absolute: 2.5 10*3/uL (ref 0.7–3.1)
Lymphs: 43 %
MCH: 27.5 pg (ref 26.6–33.0)
MCHC: 32.6 g/dL (ref 31.5–35.7)
MCV: 84 fL (ref 79–97)
Monocytes Absolute: 0.5 10*3/uL (ref 0.1–0.9)
Monocytes: 9 %
Neutrophils Absolute: 2.6 10*3/uL (ref 1.4–7.0)
Neutrophils: 44 %
Platelets: 285 10*3/uL (ref 150–450)
RBC: 5.17 x10E6/uL (ref 4.14–5.80)
RDW: 12.6 % (ref 11.6–15.4)
WBC: 5.8 10*3/uL (ref 3.4–10.8)

## 2023-01-04 LAB — PSA: Prostate Specific Ag, Serum: 0.6 ng/mL (ref 0.0–4.0)

## 2023-01-09 ENCOUNTER — Encounter: Payer: Self-pay | Admitting: Nurse Practitioner

## 2023-01-09 DIAGNOSIS — R195 Other fecal abnormalities: Secondary | ICD-10-CM | POA: Insufficient documentation

## 2023-01-09 DIAGNOSIS — Z1211 Encounter for screening for malignant neoplasm of colon: Secondary | ICD-10-CM | POA: Insufficient documentation

## 2023-01-09 DIAGNOSIS — Z0000 Encounter for general adult medical examination without abnormal findings: Secondary | ICD-10-CM | POA: Insufficient documentation

## 2023-01-09 DIAGNOSIS — Z2821 Immunization not carried out because of patient refusal: Secondary | ICD-10-CM | POA: Insufficient documentation

## 2023-01-09 DIAGNOSIS — E6609 Other obesity due to excess calories: Secondary | ICD-10-CM | POA: Insufficient documentation

## 2023-01-09 NOTE — Assessment & Plan Note (Signed)
Positive Hemoccult blood will refer to GI for further evaluation.

## 2023-01-09 NOTE — Assessment & Plan Note (Signed)
 According to USPTF Colorectal cancer Screening guidelines. Colonoscopy is recommended every 10 years, starting at age 51 years. Will refer to GI for colon cancer screening.

## 2023-01-09 NOTE — Assessment & Plan Note (Signed)
Declines shingrix, educated on disease process and is aware if he changes his mind to notify office  

## 2023-01-09 NOTE — Assessment & Plan Note (Signed)
Behavior modifications discussed and diet history reviewed.   Pt will continue to exercise regularly and modify diet with low GI, plant based foods and decrease intake of processed foods.  Recommend intake of daily multivitamin, Vitamin D, and calcium.  Recommend colonoscopy for preventive screenings, as well as recommend immunizations that include influenza, TDAP, and Shingles  

## 2023-01-09 NOTE — Assessment & Plan Note (Signed)

## 2023-01-09 NOTE — Assessment & Plan Note (Signed)
He is encouraged to strive for BMI less than 30 to decrease cardiac risk. Advised to aim for at least 150 minutes of exercise per week.  

## 2023-01-09 NOTE — Assessment & Plan Note (Signed)

## 2023-02-20 ENCOUNTER — Encounter: Payer: Self-pay | Admitting: Gastroenterology

## 2023-05-06 ENCOUNTER — Ambulatory Visit: Payer: 59 | Admitting: Gastroenterology

## 2023-05-27 ENCOUNTER — Encounter (HOSPITAL_BASED_OUTPATIENT_CLINIC_OR_DEPARTMENT_OTHER): Payer: Self-pay | Admitting: Emergency Medicine

## 2023-05-27 ENCOUNTER — Other Ambulatory Visit: Payer: Self-pay

## 2023-05-27 ENCOUNTER — Emergency Department (HOSPITAL_BASED_OUTPATIENT_CLINIC_OR_DEPARTMENT_OTHER)
Admission: EM | Admit: 2023-05-27 | Discharge: 2023-05-27 | Disposition: A | Discharge status: Home / Self Care | Attending: Emergency Medicine | Admitting: Emergency Medicine

## 2023-05-27 ENCOUNTER — Emergency Department (HOSPITAL_BASED_OUTPATIENT_CLINIC_OR_DEPARTMENT_OTHER)

## 2023-05-27 DIAGNOSIS — R519 Headache, unspecified: Secondary | ICD-10-CM | POA: Diagnosis present

## 2023-05-27 DIAGNOSIS — R030 Elevated blood-pressure reading, without diagnosis of hypertension: Secondary | ICD-10-CM | POA: Insufficient documentation

## 2023-05-27 LAB — RESP PANEL BY RT-PCR (RSV, FLU A&B, COVID)  RVPGX2
Influenza A by PCR: NEGATIVE
Influenza B by PCR: NEGATIVE
Resp Syncytial Virus by PCR: NEGATIVE
SARS Coronavirus 2 by RT PCR: NEGATIVE

## 2023-05-27 MED ORDER — ACETAMINOPHEN 500 MG PO TABS
1000.0000 mg | ORAL_TABLET | Freq: Once | ORAL | Status: AC
Start: 2023-05-27 — End: 2023-05-27
  Administered 2023-05-27: 1000 mg via ORAL
  Filled 2023-05-27: qty 2

## 2023-05-27 MED ORDER — IBUPROFEN 400 MG PO TABS
400.0000 mg | ORAL_TABLET | Freq: Once | ORAL | Status: AC
  Administered 2023-05-27: 400 mg via ORAL
  Filled 2023-05-27: qty 1

## 2023-05-27 NOTE — ED Provider Notes (Signed)
 [place] EMERGENCY DEPARTMENT AT [facility] Provider Note   CSN: [id number] Arrival date & time: 05/27/23  1706     History  Chief Complaint  Patient presents with  . Headache    Victor Ingram is a 53 y.o. male.  Patient c/o recurrent headaches in past month. Dull pain, several times per week, generalized. No specific exacerbating or alleviating factors. Not related to specific position or time of day. No focal pain. No eye pain, redness, tearing or change in vision. No neck pain or stiffness. No associated nausea/vomiting. No associated numbness/weakness, dizziness, problems with balance, coordination or normal functional ability.  Recent mild nasal congestion, occasional non prod cough. No sore throat. No sinus pain. No ear pain. No specific known ill contacts. No fever or chills.  No recent head trauma or contusion. No syncope. Indicates normally may get a rare headache, but now more frequent. No specific prior diagnosis of migraine, cluster or other specific headache type.   The history is provided by the patient and medical records.  Headache Associated symptoms: congestion   Associated symptoms: no abdominal pain, no eye pain, no fever, no nausea, no neck pain, no neck stiffness, no numbness, no sore throat, no vomiting and no weakness        Home Medications Prior to Admission medications   Medication Sig Start Date End Date Taking? Authorizing Provider  acetaminophen (TYLENOL) 500 MG tablet Take 2 tablets (1,000 mg total) by mouth every 8 (eight) hours as needed for moderate pain. 10/03/22 10/03/23  Ellender Hose, NP  bisacodyl (DULCOLAX) 5 MG EC tablet Take 1 tablet (5 mg total) by mouth daily as needed for moderate constipation or mild constipation. Patient not taking: Reported on 01/03/2023 10/03/22 10/03/23  Ellender Hose, NP  cetirizine (ZYRTEC) 10 MG tablet Take 10 mg by mouth daily.    [provider]  fluticasone (FLONASE) 50 MCG/ACT nasal spray  Place 2 sprays into both nostrils daily. 12/23/13   Reuben Likes, MD      Allergies    Patient has no known allergies.    Review of Systems   Review of Systems  Constitutional:  Negative for chills and fever.  HENT:  Positive for congestion. Negative for sore throat.   Eyes:  Negative for pain, discharge, redness and visual disturbance.  Respiratory:  Negative for shortness of breath.   Cardiovascular:  Negative for chest pain.  Gastrointestinal:  Negative for abdominal pain, nausea and vomiting.  Musculoskeletal:  Negative for neck pain and neck stiffness.  Skin:  Negative for rash.  Neurological:  Positive for headaches. Negative for syncope, speech difficulty, weakness and numbness.    Physical Exam Updated Vital Signs BP (!) 150/96 (BP Location: Left Arm)   Pulse 66   Temp 98 F (36.7 C)   Resp 18   SpO2 99%  Physical Exam Vitals and nursing note reviewed.  Constitutional:      Appearance: Normal appearance. He is well-developed.  HENT:     Head: Atraumatic.     Comments: No sinus or temporal tenderness. No mastoid tenderness.     Right Ear: Tympanic membrane normal.     Left Ear: Tympanic membrane normal.     Nose: Nose normal.     Mouth/Throat:     Mouth: Mucous membranes are moist.     Pharynx: Oropharynx is clear.  Eyes:     General: No scleral icterus.       Right eye: No discharge.  Left eye: No discharge.     Extraocular Movements: Extraocular movements intact.     Conjunctiva/sclera: Conjunctivae normal.     Pupils: Pupils are equal, round, and reactive to light.  Neck:     Trachea: No tracheal deviation.     Comments: No neck stiffness or rigidity. Normal rom comfortably.  Cardiovascular:     Rate and Rhythm: Normal rate and regular rhythm.     Pulses: Normal pulses.     Heart sounds: Normal heart sounds. No murmur heard.    No friction rub. No gallop.  Pulmonary:     Effort: Pulmonary effort is normal. No accessory muscle usage or  respiratory distress.     Breath sounds: Normal breath sounds.  Abdominal:     General: There is no distension.     Palpations: Abdomen is soft.     Tenderness: There is no abdominal tenderness.  Musculoskeletal:        General: No swelling.     Cervical back: Normal range of motion and neck supple. No rigidity or tenderness.     Right lower leg: No edema.     Left lower leg: No edema.  Lymphadenopathy:     Cervical: No cervical adenopathy.  Skin:    General: Skin is warm and dry.     Findings: No rash.  Neurological:     Mental Status: He is alert.     Comments: Alert, speech clear. No dysarthria or aphasia. Motor/sens grossly intact bil, stre 5/5. No pronator drift. Steady gait.   Psychiatric:        Mood and Affect: Mood normal.    ED Results / Procedures / Treatments   Labs (all labs ordered are listed, but only abnormal results are displayed) Labs Reviewed  RESP PANEL BY RT-PCR (RSV, FLU A&B, COVID)  [id number]    EKG None  Radiology CT Head Wo Contrast Result Date: 05/27/2023 CLINICAL DATA:  Headache. EXAM: CT HEAD WITHOUT CONTRAST TECHNIQUE: Contiguous axial images were obtained from the base of the skull through the vertex without intravenous contrast. RADIATION DOSE REDUCTION: This exam was performed according to the departmental dose-optimization program which includes automated exposure control, adjustment of the mA and/or kV according to patient size and/or use of iterative reconstruction technique. COMPARISON:  None Available. FINDINGS: Brain: No evidence of acute infarction, hemorrhage, hydrocephalus, extra-axial collection or mass lesion/mass effect. A very small chronic infarct is seen involving the white matter of the right frontal lobe (axial CT image 19, CT series 2). Vascular: No hyperdense vessel or unexpected calcification. Skull: Normal. Negative for fracture or focal lesion. Sinuses/Orbits: No acute finding. Other: None. IMPRESSION: 1. No acute intracranial  abnormality. 2. Very small chronic right frontal lobe infarct. Electronically Signed   By: Aram Candela M.D.   On: 05/27/2023 22:18    Procedures Procedures    Medications Ordered in ED Medications  acetaminophen (TYLENOL) tablet 1,000 mg (1,000 mg Oral Given 05/27/23 2144)  ibuprofen (ADVIL) tablet 400 mg (400 mg Oral Given 05/27/23 2144)    ED Course/ Medical Decision Making/ A&P                                 Medical Decision Making Problems Addressed: Elevated blood pressure reading: acute illness or injury Generalized headache: acute illness or injury Recurrent headache: acute illness or injury  Amount and/or Complexity of Data Reviewed External Data Reviewed: notes. Labs: ordered. Decision-making details documented  in ED Course. Radiology: ordered and independent interpretation performed. Decision-making details documented in ED Course.  Risk OTC drugs. Prescription drug management.  No meds pta.   Labs and imaging ordered.   Reviewed nursing notes and prior charts for additional history.   Acetaminophen po, ibuprofen po. Po fluids.  CT reviewed/interpreted by me - no hem/acute process.   Rec pcp/neurology f/u.   Return precautions provided.          Final Clinical Impression(s) / ED Diagnoses Final diagnoses:  Generalized headache  Recurrent headache  Elevated blood pressure reading    Rx / DC Orders ED Discharge Orders          Ordered    Ambulatory referral to Neurology       Comments: An appointment is requested in approximately: 2 weeks   05/27/23 2150              Cathren Laine, MD 05/27/23 2223

## 2023-05-27 NOTE — ED Triage Notes (Signed)
 Headache x few weeks  Top of head On and off Recent cold symptoms

## 2023-05-27 NOTE — Discharge Instructions (Addendum)
 It was our pleasure to provide your ER care today - we hope that you feel better.  Drink plenty of fluids/stay well hydrated.  Take acetaminophen, ibuprofen, or excedrin as need.   For frequent headaches, follow up closely with primary care doctor and/or neurology in the next 1-2 weeks. Also follow up with primary care doctor regarding your blood pressure that is high tonight.   Return to ER if worse, new symptoms, severe headache, persistent vomiting, change in vision or speech, numbness/weakness, fainting, fevers, or other concern.   Your covid/flu test is pending - you may check MyChart or call in AM for results.

## 2023-05-29 ENCOUNTER — Telehealth: Payer: Self-pay | Admitting: Neurology

## 2023-05-29 ENCOUNTER — Telehealth: Payer: Self-pay

## 2023-05-29 ENCOUNTER — Ambulatory Visit: Admitting: Neurology

## 2023-05-29 NOTE — Telephone Encounter (Signed)
 Rescheduled appt 09/23/23 at 11:30am. Added patient to the waitlist

## 2023-05-29 NOTE — Transitions of Care (Post Inpatient/ED Visit) (Signed)
   05/29/2023  Name: Victor Ingram MRN: [id number] DOB: 01/17/1971  Today's TOC FU Call Status: Today's TOC FU Call Status:: Unsuccessful Call (1st Attempt) Unsuccessful Call (1st Attempt) Date: 05/29/23  Attempted to reach the patient regarding the most recent Inpatient/ED visit.  Follow Up Plan: Additional outreach attempts will be made to reach the patient to complete the Transitions of Care (Post Inpatient/ED visit) call.   Signature Lisabeth Devoid, [place]

## 2023-05-29 NOTE — Telephone Encounter (Signed)
 [facility] cancellation

## 2023-06-04 ENCOUNTER — Telehealth: Payer: Self-pay

## 2023-06-04 NOTE — Progress Notes (Signed)
 Madelaine Bhat, CMA,acting as a Neurosurgeon for Arnette Felts, FNP.,have documented all relevant documentation on the behalf of Arnette Felts, FNP,as directed by  Arnette Felts, FNP while in the presence of Arnette Felts, FNP.  Subjective:  Patient ID: Victor Ingram , male    DOB: [birth year] , 53 y.o.   MRN: [id number]  Chief Complaint  Patient presents with   Headache    HPI  Patient presents today for a ER follow up, patient reports compliance with medications. Patient denies any chest pain, SOB. Patient went on 05/27/2023. Patient was diagnosed with Generalized headache.    Patient reports today they are feeling better but are still having headaches on and off. Patient reports he has headaches every other day that last a few hours, 5/10 on pain scale , throbbing radiates down his neck.  No blurry vision, tremors, photosensitivity or audio sensitivity.   Takes ibuprofen for relief, is having to take once a day.  Advised to not take daily to avoid rebound headaches.  Has not re-checked BP at home, advised to buy cuff and take BP once a day to monitor trends.   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  Lives in new home, works with chemicals at [employer], headaches started with new job and new home.  Has gas heat, has CM detector.   Headache  This is a recurrent problem. The current episode started 1 to 4 weeks ago. The problem occurs daily. The problem has been unchanged. The pain is located in the Bilateral region. The pain radiates to the right neck and left neck. The pain quality is not similar to prior headaches. The quality of the pain is described as aching and throbbing. The pain is at a severity of 5/10. The pain is moderate. Associated symptoms include neck pain (with headache). Nothing aggravates the symptoms. He has tried acetaminophen and NSAIDs for the symptoms. The treatment provided mild relief. His past medical history is significant for  hypertension.     Past Medical History:  Diagnosis Date   Atypical chest pain 06/30/2019   Lower abdominal pain 10/10/2022     Family History  Problem Relation Age of Onset   Arthritis Mother    Diabetes Father    Healthy Sister    Healthy Sister    Healthy Sister    Healthy Brother    Healthy Brother    Healthy Brother    Stroke Maternal Grandmother    Congestive Heart Failure Maternal Grandfather    Heart failure Maternal Grandfather    Cancer Paternal Grandmother    Hearing loss Paternal Grandfather      Current Outpatient Medications:    acetaminophen (TYLENOL) 500 MG tablet, Take 2 tablets (1,000 mg total) by mouth every 8 (eight) hours as needed for moderate pain., Disp: 100 tablet, Rfl: 0   cetirizine (ZYRTEC) 10 MG tablet, Take 10 mg by mouth daily., Disp: , Rfl:    fluticasone (FLONASE) 50 MCG/ACT nasal spray, Place 2 sprays into both nostrils daily., Disp: 16 g, Rfl: 0   Rimegepant Sulfate (NURTEC) 75 MG TBDP, Take 1 tablet (75 mg total) by mouth daily., Disp: 16 tablet, Rfl: 2   bisacodyl (DULCOLAX) 5 MG EC tablet, Take 1 tablet (5 mg total) by mouth daily as needed for moderate constipation or mild constipation. (Patient not taking: Reported on 06/05/2023), Disp: 30 tablet, Rfl: 1   No Known Allergies  Review of Systems  Constitutional: Negative.   HENT: Negative.    Eyes: Negative.   Respiratory: Negative.    Cardiovascular: Negative.   Gastrointestinal: Negative.   Musculoskeletal:  Positive for neck pain (with headache).  Skin: Negative.   Neurological:  Positive for headaches.     Today's Vitals   06/05/23 1203  BP: 120/80  Pulse: 73  Temp: 98.5 F (36.9 C)  TempSrc: Oral  Weight: 236 lb 9.6 oz (107.3 kg)  Height: 6\' 1"  (1.854 m)  PainSc: 0-No pain   Body mass index is 31.22 kg/m.  Wt Readings from Last 3 Encounters:  06/05/23 236 lb 9.6 oz (107.3 kg)  01/03/23 234 lb 3.2 oz (106.2 kg)  10/03/22 231 lb (104.8 kg)     Objective:   Physical Exam Vitals reviewed.  Constitutional:      Appearance: He is obese.  HENT:     Head: Normocephalic and atraumatic.     Mouth/Throat:     Mouth: Mucous membranes are moist.     Pharynx: Oropharynx is clear.  Eyes:     Pupils: Pupils are equal, round, and reactive to light.  Cardiovascular:     Rate and Rhythm: Normal rate and regular rhythm.     Heart sounds: Normal heart sounds.  Pulmonary:     Effort: Pulmonary effort is normal.     Breath sounds: Normal breath sounds.  Musculoskeletal:        General: Normal range of motion.     Cervical back: Normal range of motion.  Skin:    General: Skin is warm and dry.     Capillary Refill: Capillary refill takes 2 to 3 seconds.  Neurological:     Mental Status: He is alert and oriented to person, place, and time. Mental status is at baseline.  Psychiatric:        Mood and Affect: Mood normal.        Speech: Speech normal.        Behavior: Behavior normal.         Assessment And Plan:  Recurrent headache Assessment & Plan: He has had a recent ER visit due to headache, will try him on Nurtec as he awaits appt with Neurology.   Orders: -     Nurtec; Take 1 tablet (75 mg total) by mouth daily.  Dispense: 16 tablet; Refill: 2  COVID-19 vaccination declined Assessment & Plan: Declines covid 19 vaccine. Discussed risk of covid 65 and if he changes her mind about the vaccine to call the office. Education has been provided regarding the importance of this vaccine but patient still declined. Advised may receive this vaccine at local pharmacy or Health Dept.or vaccine clinic. Aware to provide a copy of the vaccination record if obtained from local pharmacy or Health Dept.  Encouraged to take multivitamin, vitamin d, vitamin c and zinc to increase immune system. Aware can call office if would like to have vaccine here at office. Verbalized acceptance and understanding.    Herpes zoster vaccination declined Assessment &  Plan: Declines shingrix, educated on disease process and is aware if he changes his mind to notify office    Class 1 obesity due to excess calories without serious comorbidity with body mass index (BMI) of 31.0 to 31.9 in adult Assessment & Plan: He is encouraged to strive for BMI less than 30 to decrease cardiac risk. Advised to aim for at least 150 minutes of exercise per week.      Return for  keep same next.  Patient was given opportunity to ask questions. Patient verbalized understanding of the plan and was able to repeat key elements of the plan. All questions were answered to their satisfaction.   I have reviewed this encounter including the documentation in this note and/or discussed this patient with Ezra Sites FNP Student.. I am certifying that I agree with the content of this note as the primary care nurse practitioner.  Arnette Felts, DNP, FNP-BC   I, Arnette Felts, FNP, have reviewed all documentation for this visit. The documentation on 06/16/23 for the exam, diagnosis, procedures, and orders are all accurate and complete.   IF YOU HAVE BEEN REFERRED TO A SPECIALIST, IT MAY TAKE 1-2 WEEKS TO SCHEDULE/PROCESS THE REFERRAL. IF YOU HAVE NOT HEARD FROM US/SPECIALIST IN TWO WEEKS, PLEASE GIVE [place] A CALL AT [phone].

## 2023-06-04 NOTE — Telephone Encounter (Signed)
 Sch for hospital follow up

## 2023-06-05 ENCOUNTER — Encounter: Payer: Self-pay | Admitting: Nurse Practitioner

## 2023-06-05 ENCOUNTER — Ambulatory Visit: Admitting: Nurse Practitioner

## 2023-06-05 VITALS — BP 120/80 | HR 73 | Temp 98.5°F | Ht 73.0 in | Wt 236.6 lb

## 2023-06-05 DIAGNOSIS — E66811 Obesity, class 1: Secondary | ICD-10-CM | POA: Diagnosis not present

## 2023-06-05 DIAGNOSIS — R519 Headache, unspecified: Secondary | ICD-10-CM | POA: Diagnosis not present

## 2023-06-05 DIAGNOSIS — Z09 Encounter for follow-up examination after completed treatment for conditions other than malignant neoplasm: Secondary | ICD-10-CM

## 2023-06-05 DIAGNOSIS — E6609 Other obesity due to excess calories: Secondary | ICD-10-CM

## 2023-06-05 DIAGNOSIS — Z2821 Immunization not carried out because of patient refusal: Secondary | ICD-10-CM

## 2023-06-05 DIAGNOSIS — Z6831 Body mass index (BMI) 31.0-31.9, adult: Secondary | ICD-10-CM

## 2023-06-05 MED ORDER — NURTEC 75 MG PO TBDP: 1.0000 | ORAL_TABLET | Freq: Every day | ORAL | 2 refills | Status: AC

## 2023-06-05 NOTE — Patient Instructions (Signed)
 Use your HSA to purchase a blood pressure monitor at your local pharmacy, check you BP daily and with headaches to monitor for periods of elevation.

## 2023-06-16 DIAGNOSIS — R519 Headache, unspecified: Secondary | ICD-10-CM | POA: Insufficient documentation

## 2023-06-16 DIAGNOSIS — E6609 Other obesity due to excess calories: Secondary | ICD-10-CM | POA: Insufficient documentation

## 2023-06-16 HISTORY — DX: Headache, unspecified: R51.9

## 2023-06-16 NOTE — Assessment & Plan Note (Signed)

## 2023-06-16 NOTE — Assessment & Plan Note (Signed)
 Declines shingrix, educated on disease process and is aware if he changes his mind to notify office

## 2023-06-16 NOTE — Assessment & Plan Note (Signed)
 He has had a recent ER visit due to headache, will try him on Nurtec as he awaits appt with Neurology.

## 2023-06-16 NOTE — Assessment & Plan Note (Signed)
 He is encouraged to strive for BMI less than 30 to decrease cardiac risk. Advised to aim for at least 150 minutes of exercise per week.

## 2023-08-15 ENCOUNTER — Encounter: Payer: Self-pay | Admitting: Physician Assistant

## 2023-08-15 ENCOUNTER — Ambulatory Visit: Admitting: Physician Assistant

## 2023-08-15 VITALS — BP 122/70 | HR 78 | Ht 73.0 in | Wt 236.0 lb

## 2023-08-15 DIAGNOSIS — R195 Other fecal abnormalities: Secondary | ICD-10-CM

## 2023-08-15 MED ORDER — NA SULFATE-K SULFATE-MG SULF 17.5-3.13-1.6 GM/177ML PO SOLN: 1.0000 | Freq: Once | ORAL | 0 refills | Status: AC

## 2023-08-15 NOTE — Patient Instructions (Signed)
 You have been scheduled for a colonoscopy. Please follow written instructions given to you at your visit today.   If you use inhalers (even only as needed), please bring them with you on the day of your procedure.  DO NOT TAKE 7 DAYS PRIOR TO TEST- Trulicity (dulaglutide) Ozempic, Wegovy (semaglutide) Mounjaro (tirzepatide) Bydureon Bcise (exanatide extended release)  DO NOT TAKE 1 DAY PRIOR TO YOUR TEST Rybelsus (semaglutide) Adlyxin (lixisenatide) Victoza (liraglutide) Byetta (exanatide) ___________________________________________________________________  _______________________________________________________  If your blood pressure at your visit was 140/90 or greater, please contact your primary care physician to follow up on this.  _______________________________________________________  If you are age 66 or older, your body mass index should be between 23-30. Your Body mass index is 31.14 kg/m. If this is out of the aforementioned range listed, please consider follow up with your Primary Care Provider.  If you are age 52 or younger, your body mass index should be between 19-25. Your Body mass index is 31.14 kg/m. If this is out of the aformentioned range listed, please consider follow up with your Primary Care Provider.   ________________________________________________________  The [facility] providers would like to encourage you to use MYCHART to communicate with providers for non-urgent requests or questions.  Due to long hold times on the telephone, sending your provider a message by [facility] may be a faster and more efficient way to get a response.  Please allow 48 business hours for a response.  Please remember that this is for non-urgent requests.  _______________________________________________________

## 2023-08-15 NOTE — Progress Notes (Signed)
 Chief Complaint: Hemoccult positive  HPI:    Victor Ingram is a 53 year old African-American male with a past medical history as listed below, who was referred to me by Susanna Epley, FNP for a complaint of positive Hemoccult testing.    12/30/2022 positive Hemoccult test.  On the same day CBC and CMP normal.  Hemoglobin A1c minimally elevated at 5.9.  PSA normal.    Today, patient presents to clinic and tells me that he had Hemoccult testing back in November of last year and was referred to us  but then moved and lost his referral letter/appointment noted so he missed his appointment.  He is here now to follow-up.  Denies any acute GI complaints or concerns and never sees any blood in his stool.    Denies fever, chills, weight loss or abdominal pain.  Past Medical History:  Diagnosis Date   Atypical chest pain 06/30/2019   Lower abdominal pain 10/10/2022    Past Surgical History:  Procedure Laterality Date   HERNIA REPAIR      Current Outpatient Medications  Medication Sig Dispense Refill   acetaminophen  (TYLENOL ) 500 MG tablet Take 2 tablets (1,000 mg total) by mouth every 8 (eight) hours as needed for moderate pain. 100 tablet 0   bisacodyl  (DULCOLAX) 5 MG EC tablet Take 1 tablet (5 mg total) by mouth daily as needed for moderate constipation or mild constipation. (Patient not taking: Reported on 06/05/2023) 30 tablet 1   cetirizine (ZYRTEC) 10 MG tablet Take 10 mg by mouth daily.     fluticasone  (FLONASE ) 50 MCG/ACT nasal spray Place 2 sprays into both nostrils daily. 16 g 0   Rimegepant Sulfate (NURTEC) 75 MG TBDP Take 1 tablet (75 mg total) by mouth daily. 16 tablet 2   No current facility-administered medications for this visit.    Allergies as of 08/15/2023   (No Known Allergies)    Family History  Problem Relation Age of Onset   Arthritis Mother    Diabetes Father    Healthy Sister    Healthy Sister    Healthy Sister    Healthy Brother    Healthy Brother     Healthy Brother    Stroke Maternal Grandmother    Congestive Heart Failure Maternal Grandfather    Heart failure Maternal Grandfather    Cancer Paternal Grandmother    Hearing loss Paternal Grandfather     Social History   Socioeconomic History   Marital status: Single    Spouse name: Not on file   Number of children: Not on file   Years of education: Not on file   Highest education level: Not on file  Occupational History   Not on file  Tobacco Use   Smoking status: Never   Smokeless tobacco: Never  Substance and Sexual Activity   Alcohol use: No   Drug use: Never   Sexual activity: Not on file  Other Topics Concern   Not on file  Social History Narrative   Not on file   Social Drivers of Health   Financial Resource Strain: Not on file  Food Insecurity: Not on file  Transportation Needs: Not on file  Physical Activity: Not on file  Stress: Not on file  Social Connections: Unknown (07/24/2021)   [facility]   Social Network    Social Network: Not on file  Intimate Partner Violence: Unknown (06/15/2021)   Received from [facility]   HITS    Physically Hurt: Not on file  Insult or Talk Down To: Not on file    Threaten Physical Harm: Not on file    Scream or Curse: Not on file    Review of Systems:    Constitutional: No weight loss, fever or chills Skin: No rash  Cardiovascular: No chest pain  Respiratory: No SOB  Gastrointestinal: See HPI and otherwise negative Genitourinary: No dysuria  Neurological: No headache, dizziness or syncope Musculoskeletal: No new muscle or joint pain Hematologic: No bruising Psychiatric: No history of depression or anxiety   Physical Exam:  Vital signs: Blood pressure 122/70, pulse 78, height 6\' 1"  (1.854 m), weight 236 lb (107 kg).   Constitutional:   Pleasant AA male appears to be in NAD, Well developed, Well nourished, alert and cooperative Head:  Normocephalic and atraumatic. Eyes:   PEERL, EOMI. No  icterus. Conjunctiva pink. Ears:  Normal auditory acuity. Neck:  Supple Throat: Oral cavity and pharynx without inflammation, swelling or lesion.  Respiratory: Respirations even and unlabored. Lungs clear to auscultation bilaterally.   No wheezes, crackles, or rhonchi.  Cardiovascular: Normal S1, S2. No MRG. Regular rate and rhythm. No peripheral edema, cyanosis or pallor.  Gastrointestinal:  Soft, nondistended, nontender. No rebound or guarding. Normal bowel sounds. No appreciable masses or hepatomegaly. Rectal:  Not performed.  Msk:  Symmetrical without gross deformities. Without edema, no deformity or joint abnormality.  Neurologic:  Alert and  oriented x4;  grossly normal neurologically.  Skin:   Dry and intact without significant lesions or rashes. Psychiatric: Demonstrates good judgement and reason without abnormal affect or behaviors.  RELEVANT LABS AND IMAGING: CBC    Component Value Date/Time   WBC 5.8 01/03/2023 1207   WBC 7.0 04/24/2019 1725   RBC 5.17 01/03/2023 1207   RBC 4.96 04/24/2019 1725   HGB 14.2 01/03/2023 1207   HCT 43.6 01/03/2023 1207   PLT 285 01/03/2023 1207   MCV 84 01/03/2023 1207   MCH 27.5 01/03/2023 1207   MCH 28.2 04/24/2019 1725   MCHC 32.6 01/03/2023 1207   MCHC 33.7 04/24/2019 1725   RDW 12.6 01/03/2023 1207   LYMPHSABS 2.5 01/03/2023 1207   EOSABS 0.1 01/03/2023 1207   BASOSABS 0.1 01/03/2023 1207    CMP     Component Value Date/Time   NA 138 01/03/2023 1207   K 4.2 01/03/2023 1207   CL 102 01/03/2023 1207   CO2 23 01/03/2023 1207   GLUCOSE 96 01/03/2023 1207   GLUCOSE 112 (H) 04/24/2019 1725   BUN 17 01/03/2023 1207   CREATININE 1.18 01/03/2023 1207   CALCIUM 9.6 01/03/2023 1207   PROT 7.4 01/03/2023 1207   ALBUMIN 4.5 01/03/2023 1207   AST 20 01/03/2023 1207   ALT 13 01/03/2023 1207   ALKPHOS 56 01/03/2023 1207   BILITOT 0.4 01/03/2023 1207   GFRNONAA >60 04/24/2019 1725   GFRAA >60 04/24/2019 1725    Assessment: 1.   Positive Hemoccult: Normal CBC back in November with positive Hemoccult, patient never had screening for colon cancer; consider colonic etiology  Plan: 1.  Scheduled patient for diagnostic colonoscopy in the [facility] with Dr. Dominic Friendly.  Did provide the patient with a detailed list of risks for the procedure and he agrees to proceed. Patient is appropriate for endoscopic procedure(s) in the ambulatory ([facility]) setting.  2.  Patient to follow in clinic per recommendations after time of procedure.  Reginal Capra, PA-C [facility] 08/15/2023, 2:11 PM  Cc: Susanna Epley, FNP

## 2023-08-20 NOTE — Progress Notes (Signed)
 ____________________________________________________________  Attending physician addendum:  Thank you for sending this case to me. I have reviewed the entire note and agree with the plan.   Amada Jupiter, MD  ____________________________________________________________

## 2023-08-28 ENCOUNTER — Encounter: Payer: Self-pay | Admitting: Gastroenterology

## 2023-09-04 ENCOUNTER — Encounter: Payer: Self-pay | Admitting: Gastroenterology

## 2023-09-04 ENCOUNTER — Ambulatory Visit: Admitting: Gastroenterology

## 2023-09-04 VITALS — BP 145/76 | HR 62 | Temp 97.9°F | Resp 19 | Ht 73.0 in | Wt 236.0 lb

## 2023-09-04 DIAGNOSIS — Z1211 Encounter for screening for malignant neoplasm of colon: Secondary | ICD-10-CM

## 2023-09-04 DIAGNOSIS — D124 Benign neoplasm of descending colon: Secondary | ICD-10-CM | POA: Diagnosis not present

## 2023-09-04 DIAGNOSIS — K648 Other hemorrhoids: Secondary | ICD-10-CM

## 2023-09-04 DIAGNOSIS — K5730 Diverticulosis of large intestine without perforation or abscess without bleeding: Secondary | ICD-10-CM

## 2023-09-04 DIAGNOSIS — R195 Other fecal abnormalities: Secondary | ICD-10-CM

## 2023-09-04 MED ORDER — SODIUM CHLORIDE 0.9 % IV SOLN: 500.0000 mL | Freq: Once | INTRAVENOUS | Status: AC

## 2023-09-04 NOTE — Progress Notes (Unsigned)
 Vss nad trans to pacu

## 2023-09-04 NOTE — Progress Notes (Unsigned)
 History and Physical:  This patient presents for endoscopic testing for: Encounter Diagnosis  Name Primary?   Heme positive stool Yes    Clinical details in 08/15/23 office consult note  Patient is otherwise without complaints or active issues today.  ED visit March 2025 for headache, CT head with incidental findings: IMPRESSION: 1. No acute intracranial abnormality. 2. Very small chronic right frontal lobe infarct.  Patient reporting no current focal weakness, blurred vision or headache. Has an upcoming neurology clinic consult.  Past Medical History: Past Medical History:  Diagnosis Date   Atypical chest pain 06/30/2019   Lower abdominal pain 10/10/2022     Past Surgical History: Past Surgical History:  Procedure Laterality Date   HERNIA REPAIR      Allergies: No Known Allergies  Outpatient Meds: Current Outpatient Medications  Medication Sig Dispense Refill   acetaminophen  (TYLENOL ) 500 MG tablet Take 2 tablets (1,000 mg total) by mouth every 8 (eight) hours as needed for moderate pain. 100 tablet 0   cetirizine (ZYRTEC) 10 MG tablet Take 10 mg by mouth daily.     fluticasone  (FLONASE ) 50 MCG/ACT nasal spray Place 2 sprays into both nostrils daily. 16 g 0   bisacodyl  (DULCOLAX) 5 MG EC tablet Take 1 tablet (5 mg total) by mouth daily as needed for moderate constipation or mild constipation. (Patient not taking: Reported on 01/03/2023) 30 tablet 1   Rimegepant Sulfate (NURTEC) 75 MG TBDP Take 1 tablet (75 mg total) by mouth daily. 16 tablet 2   Current Facility-Administered Medications  Medication Dose Route Frequency Provider Last Rate Last Admin   0.9 %  sodium chloride infusion  500 mL Intravenous Once Danis, Victory CROME III, MD          ___________________________________________________________________ Objective   Exam:  BP 130/76   Pulse 74   Temp 97.9 F (36.6 C) (Temporal)   Ht 6' 1 (1.854 m)   Wt 236 lb (107 kg)   SpO2 98%   BMI 31.14 kg/m    CV: regular , S1/S2 Resp: clear to auscultation bilaterally, normal RR and effort noted GI: soft, no tenderness, with active bowel sounds.   Assessment: Encounter Diagnosis  Name Primary?   Heme positive stool Yes     Plan: Colonoscopy   The benefits and risks of the planned procedure(s) were described in detail with the patient or (when appropriate) their health care proxy.  Risks were outlined as including, but not limited to, bleeding, infection, perforation, adverse medication reaction leading to cardiac or pulmonary decompensation, pancreatitis (if ERCP).  The limitation of incomplete mucosal visualization was also discussed.  No guarantees or warranties were given.  The patient is appropriate for an endoscopic procedure in the ambulatory setting.   - Victory Brand, MD

## 2023-09-04 NOTE — Progress Notes (Unsigned)
 Pt's states no medical or surgical changes since previsit or office visit.

## 2023-09-04 NOTE — Op Note (Addendum)
 [facility] Patient Name: Victor Ingram Procedure Date: 09/04/2023 4:21 PM MRN: [id number] Endoscopist: Victory L. Legrand , MD, [id number] Age: 53 Referring MD:  Date of Birth: May 17, 1970 Gender: Male Account #: [id number] Procedure:                Colonoscopy Indications:              Heme positive stool                           asymptomatic, normal hemoglobin Medicines:                Monitored Anesthesia Care Procedure:                Pre-Anesthesia Assessment:                           - Prior to the procedure, a History and Physical                            was performed, and patient medications and                            allergies were reviewed. The patient's tolerance of                            previous anesthesia was also reviewed. The risks                            and benefits of the procedure and the sedation                            options and risks were discussed with the patient.                            All questions were answered, and informed consent                            was obtained. Prior Anticoagulants: The patient has                            taken no anticoagulant or antiplatelet agents. ASA                            Grade Assessment: II - A patient with mild systemic                            disease. After reviewing the risks and benefits,                            the patient was deemed in satisfactory condition to                            undergo the procedure.  After obtaining informed consent, the colonoscope                            was passed under direct vision. Throughout the                            procedure, the patient's blood pressure, pulse, and                            oxygen saturations were monitored continuously. The                            Olympus Scope [id number] was introduced through the                            anus and advanced to the the cecum, identified by                             appendiceal orifice and ileocecal valve. The                            colonoscopy was somewhat difficult due to a                            redundant colon. Successful completion of the                            procedure was aided by using manual pressure and                            straightening and shortening the scope to obtain                            bowel loop reduction. The patient tolerated the                            procedure well. The quality of the bowel                            preparation was good except the cecum was fair                            despite lavage. The ileocecal valve, appendiceal                            orifice, and rectum were photographed. The bowel                            preparation used was SUPREP. Scope In: 4:33:01 PM Scope Out: 4:53:03 PM Scope Withdrawal Time: 0 hours 14 minutes 30 seconds  Total Procedure Duration: 0 hours 20 minutes 2 seconds  Findings:                 The perianal and digital rectal examinations were  normal.                           Repeat examination of right colon under NBI                            performed.                           Multiple diverticula were found in the left colon                            and right colon.                           A diminutive polyp was found in the descending                            colon. The polyp was sessile. The polyp was removed                            with a cold snare. Resection and retrieval were                            complete.                           Internal hemorrhoids were found. The hemorrhoids                            were small.                           The exam was otherwise without abnormality on                            direct and retroflexion views. Complications:            No immediate complications. Estimated Blood Loss:     Estimated blood loss was minimal. Impression:                - Diverticulosis in the left colon and in the right                            colon.                           - One diminutive polyp in the descending colon,                            removed with a cold snare. Resected and retrieved.                           - Internal hemorrhoids.                           - The examination was otherwise normal on direct  and retroflexion views.                           Apparent false positive FOBT Recommendation:           - Patient has a contact number available for                            emergencies. The signs and symptoms of potential                            delayed complications were discussed with the                            patient. Return to normal activities tomorrow.                            Written discharge instructions were provided to the                            patient.                           - Resume previous diet.                           - Continue present medications.                           - Await pathology results.                           - Repeat colonoscopy in 3 years for polyp                            surveillance and suboptimal proximal right colon                            preparation. (Golytely prep for future colonoscopy) Kamber Vignola L. Legrand, MD 09/04/2023 4:58:50 PM This report has been signed electronically.

## 2023-09-04 NOTE — Progress Notes (Signed)
 Called to room to assist during endoscopic procedure.  Patient ID and intended procedure confirmed with present staff. Received instructions for my participation in the procedure from the performing physician.

## 2023-09-04 NOTE — Patient Instructions (Signed)
 YOU HAD AN ENDOSCOPIC PROCEDURE TODAY AT THE [facility]:   Refer to the procedure report that was given to you for any specific questions about what was found during the examination.  If the procedure report does not answer your questions, please call your gastroenterologist to clarify.  If you requested that your care partner not be given the details of your procedure findings, then the procedure report has been included in a sealed envelope for you to review at your convenience later.  YOU SHOULD EXPECT: Some feelings of bloating in the abdomen. Passage of more gas than usual.  Walking can help get rid of the air that was put into your GI tract during the procedure and reduce the bloating. If you had a lower endoscopy (such as a colonoscopy or flexible sigmoidoscopy) you may notice spotting of blood in your stool or on the toilet paper. If you underwent a bowel prep for your procedure, you may not have a normal bowel movement for a few days.  Please Note:  You might notice some irritation and congestion in your nose or some drainage.  This is from the oxygen used during your procedure.  There is no need for concern and it should clear up in a day or so.  SYMPTOMS TO REPORT IMMEDIATELY:  Following lower endoscopy (colonoscopy or flexible sigmoidoscopy):  Excessive amounts of blood in the stool  Significant tenderness or worsening of abdominal pains  Swelling of the abdomen that is new, acute  Fever of 100F or higher   For urgent or emergent issues, a gastroenterologist can be reached at any hour by calling [phone]. Do not use MyChart messaging for urgent concerns.    DIET:  We do recommend a small meal at first, but then you may proceed to your regular diet.  Drink plenty of fluids but you should avoid alcoholic beverages for 24 hours.  MEDICATIONS: Continue present medications.  Please see handouts given to you my your recovery nurse: Polyps, Diverticulosis,  Hemorrhoids.  FOLLOW UP: Await pathology results. Repeat colonoscopy in 3 years for polyp surveillance and suboptimal proximal right colon preparation.  Thank you for allowing us  to provide for your healthcare needs today.  ACTIVITY:  You should plan to take it easy for the rest of today and you should NOT DRIVE or use heavy machinery until tomorrow (because of the sedation medicines used during the test).    FOLLOW UP: Our staff will call the number listed on your records the next business day following your procedure.  We will call around 7:15- 8:00 am to check on you and address any questions or concerns that you may have regarding the information given to you following your procedure. If we do not reach you, we will leave a message.     If any biopsies were taken you will be contacted by phone or by letter within the next 1-3 weeks.  Please call us  at [phone] if you have not heard about the biopsies in 3 weeks.    SIGNATURES/CONFIDENTIALITY: You and/or your care partner have signed paperwork which will be entered into your electronic medical record.  These signatures attest to the fact that that the information above on your After Visit Summary has been reviewed and is understood.  Full responsibility of the confidentiality of this discharge information lies with you and/or your care-partner.

## 2023-09-05 ENCOUNTER — Telehealth: Payer: Self-pay

## 2023-09-05 NOTE — Telephone Encounter (Signed)
  Follow up Call-     09/04/2023    3:23 PM  Call back number  Post procedure Call Back phone  # [phone]  Permission to leave phone message Yes     Patient questions:  Do you have a fever, pain , or abdominal swelling? No. Pain Score  0 *  Have you tolerated food without any problems? Yes.    Have you been able to return to your normal activities? Yes.    Do you have any questions about your discharge instructions: Diet   No. Medications  No. Follow up visit  No.  Do you have questions or concerns about your Care? No.  Actions: * If pain score is 4 or above: No action needed, pain <4.

## 2023-09-09 LAB — SURGICAL PATHOLOGY

## 2023-09-11 ENCOUNTER — Ambulatory Visit: Admitting: Neurology

## 2023-09-18 ENCOUNTER — Ambulatory Visit: Payer: Self-pay | Admitting: Gastroenterology

## 2023-09-23 ENCOUNTER — Ambulatory Visit: Admitting: Neurology

## 2023-12-18 ENCOUNTER — Encounter: Payer: Self-pay | Admitting: Neurology

## 2023-12-18 ENCOUNTER — Ambulatory Visit: Admitting: Neurology

## 2023-12-18 VITALS — BP 121/75 | HR 55 | Ht 73.0 in | Wt 231.6 lb

## 2023-12-18 DIAGNOSIS — Z9189 Other specified personal risk factors, not elsewhere classified: Secondary | ICD-10-CM

## 2023-12-18 DIAGNOSIS — R0683 Snoring: Secondary | ICD-10-CM

## 2023-12-18 DIAGNOSIS — R519 Headache, unspecified: Secondary | ICD-10-CM

## 2023-12-18 DIAGNOSIS — Z820 Family history of epilepsy and other diseases of the nervous system: Secondary | ICD-10-CM

## 2023-12-18 NOTE — Progress Notes (Signed)
 Subjective:    Patient ID: Victor Ingram is a 53 y.o. male.  HPI    True Mar, MD, PhD [facility] [address] [address] [place], [place] [address]  I saw patient, Victor Ingram, as a referral from the emergency room for evaluation of his headache.  The patient is unaccompanied today.  Victor Ingram is a 53 year old male with an underlying medical history of allergies, atypical chest pain, abdominal pain, and obesity, who reports having had recurrent headaches for several weeks in the recent past but overall feels better.  He reports that he thought stress from working 2 jobs, the work environment for his second job and maybe flareup and allergies caused his headaches.  He was given Nurtec by his PCP and took it for a week or 2 but no longer takes it as he feels better.  Headaches were on the top of his head, sometimes throbbing, sometimes constant.  Significant photophobia or nausea or vomiting.  He no longer takes any over-the-counter medication such as ibuprofen  or Tylenol  either. He reports that his headaches are better.   He presented to the emergency room on 05/27/2023 with recurrent headaches for about a month.  I reviewed the emergency room records.  He had a head CT without contrast on 05/27/2023 and I reviewed the results:  IMPRESSION: 1. No acute intracranial abnormality. 2. Very small chronic right frontal lobe infarct.   His blood pressure was elevated in the ER.  He was treated symptomatically with Tylenol , ibuprofen , and laboratory testing included RSV test, flu test, and COVID testing, all negative.   He has not had a sleep study.  He snores and has 2 brothers with sleep apnea.  His Epworth sleepiness score is 3 out of 24, fatigue severity score is 19 out of 63.  He lives with his wife who has not noticed any breathing pauses while he is asleep.  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   Goes to bed around 10 or 11 and rise time is around 6.  He has no nightly nocturia and no longer has any morning headaches and has had rare nocturnal headaches.  He has not had a formal eye examination in over 5 years.  He does not drink caffeine daily.  He is a non-smoker and does not drink any alcohol.  His Past Medical History Is Significant For: Past Medical History:  Diagnosis Date   Atypical chest pain 06/30/2019   Lower abdominal pain 10/10/2022    His Past Surgical History Is Significant For: Past Surgical History:  Procedure Laterality Date   HERNIA REPAIR      His Family History Is Significant For: Family History  Problem Relation Age of Onset   Arthritis Mother    Diabetes Father    Healthy Sister    Healthy Sister    Healthy Sister    Sleep apnea Brother    Healthy Brother    Healthy Brother    Stroke Maternal Grandmother    Congestive Heart Failure Maternal Grandfather    Heart failure Maternal Grandfather    Cancer Paternal Grandmother    Hearing loss Paternal Grandfather    Migraines Neg Hx    Headache Neg Hx     His Social History Is Significant For: Social History   Socioeconomic History   Marital status: Single    Spouse name: Not on file   Number of children:  1   Years of education: Not on file   Highest education level: Not on file  Occupational History   Occupation: city of [place]  Tobacco Use   Smoking status: Never   Smokeless tobacco: Never  Vaping Use   Vaping status: Never Used  Substance and Sexual Activity   Alcohol use: No   Drug use: Never   Sexual activity: Not on file  Other Topics Concern   Not on file  Social History Narrative   Pt lives with family   Pt works    Social Drivers of Corporate investment banker Strain: Not on file  Food Insecurity: Not on file  Transportation Needs: Not on file  Physical Activity: Not on file  Stress: Not on file  Social Connections: Unknown (07/24/2021)   Received from [facility]   Social Network    Social Network: Not on file    His Allergies Are:  No Known Allergies:   His Current Medications Are:  Outpatient Encounter Medications as of 12/18/2023  Medication Sig   cetirizine (ZYRTEC) 10 MG tablet Take 10 mg by mouth daily. (Patient taking differently: Take 10 mg by mouth as needed.)   fluticasone  (FLONASE ) 50 MCG/ACT nasal spray Place 2 sprays into both nostrils daily. (Patient taking differently: Place 2 sprays into both nostrils as needed.)   Multiple Vitamins-Minerals (ONE-A-DAY 50 PLUS PO) Take by mouth.   Rimegepant Sulfate (NURTEC) 75 MG TBDP Take 1 tablet (75 mg total) by mouth daily. (Patient taking differently: Take 1 tablet by mouth as needed.)   Facility-Administered Encounter Medications as of 12/18/2023  Medication   0.9 %  sodium chloride  infusion  :   Review of Systems:  Out of a complete 14 point review of systems, all are reviewed and negative with the exception of these symptoms as listed below:  Review of Systems  Neurological:        Pt states here for headaches Pt states headaches are better     ESS:3 FSS:19    Objective:  Neurological Exam  Physical Exam Physical Examination:   Vitals:   12/18/23 0828  BP: 121/75  Pulse: (!) 55    General Examination: The patient is a very pleasant 53 y.o. male in no acute distress. He appears well-developed and well-nourished and well groomed.   HEENT: Normocephalic, atraumatic, pupils are equal, round and reactive to light, extraocular tracking is good without limitation to gaze excursion or nystagmus noted. No photophobia.  No corrective eye glasses in place. Hearing is grossly intact.  He is wearing a facemask but is able to remove it for the HEENT exam.  Face is symmetric with normal facial animation and normal sensation to light touch, temperature and vibration sense. Speech is clear without dysarthria. There is no hypophonia. There is no lip, neck/head, jaw or voice  tremor. Neck is supple with full range of passive and active motion. There are no carotid bruits on auscultation.  Airway/Oropharynx exam reveals: mild mouth dryness, good dental hygiene and moderate airway crowding, due to thicker soft palate, larger uvula, tonsils about 1-2+, wider tongue.  Mallampati class III. Tongue protrudes centrally and palate elevates symmetrically.  Neck circumference 16 inches, minimal overbite noted.  Chest: Clear to auscultation without wheezing, rhonchi or crackles noted.  Heart: S1+S2+0, regular and normal without murmurs, rubs or gallops noted.   Abdomen: Soft, non-tender and non-distended.  Extremities: There is no pitting edema in the distal lower extremities bilaterally.   Skin: Warm and  dry without trophic changes noted.   Musculoskeletal: exam reveals no obvious joint deformities.   Neurologically:  Mental status: The patient is awake, alert and oriented in all 4 spheres. His immediate and remote memory, attention, language skills and fund of knowledge are appropriate. There is no evidence of aphasia, agnosia, apraxia or anomia. Speech is clear with normal prosody and enunciation. Thought process is linear. Mood is normal and affect is normal.  Cranial nerves II - XII are as described above under HEENT exam.  Motor exam: Normal bulk, strength and tone is noted. There is no obvious action or resting tremor.  No drift or rebound, no postural or intention tremor.  Reflexes 1+ throughout, toes are downgoing bilaterally. Fine motor skills and coordination: Intact finger taps, hand movement and rapid alternating patting in both upper extremities, normal foot taps bilaterally in the lower extremities.  Cerebellar testing: No dysmetria or intention tremor. There is no truncal or gait ataxia.  Normal finger-to-nose, normal heel-to-shin bilaterally. Sensory exam: intact to light touch, temperature and vibration sense in the upper and lower extremities.  Romberg  negative. Gait, station and balance: He stands easily. No veering to one side is noted. No leaning to one side is noted. Posture is age-appropriate and stance is narrow based. Gait shows normal stride length and normal pace. No problems turning are noted.  Normal tandem walk.  Assessment and Plan:  In summary, Victor Ingram is a very pleasant 53 y.o.-year old male with an underlying medical history of allergies, atypical chest pain, abdominal pain, and obesity, who presents for evaluation of his new onset headaches, started about 7 months ago but is now improved for the past at least 1 month.  Headaches are likely of a mixed type of nature.  I had a long discussion with the patient today.  Neurological exam is nonfocal and he is largely reassured.  This was an extended visit of over 60 minutes due to copious record review involved in considerable counseling and coordination of care.  We talked about his risk for obstructive sleep apnea, treatment of sleep apnea including surgical and nonsurgical options as well.  Below is a summary of my recommendations and our discussion points from today's visit, based on chart review, history and examination. They were given these instructions verbally during the visit in detail and also in writing in the MyChart after visit summary (AVS), which they can access electronically. <<  Please remember, common headache triggers are: sleep deprivation, dehydration, overheating, stress, hypoglycemia or skipping meals and blood sugar fluctuations, excessive pain medications or excessive alcohol use or caffeine withdrawal. Some people have food triggers such as aged cheese, orange juice or chocolate, especially dark chocolate, or MSG (monosodium glutamate). Try to avoid these headache triggers as much possible. It may be helpful to keep a headache diary to figure out what makes your headaches worse or brings them on and what alleviates them. Some people report headache onset  after exercise but studies have shown that regular exercise may actually prevent headaches from coming. If you have exercise-induced headaches, please make sure that you drink plenty of fluid before and after exercising and that you do not over do it and do not overheat. Please continue to stay well-hydrated and limit your caffeine as you are.   Please get an updated eye exam. You can seen any optometrist or ophthalmologist of your choosing. Strain on the eyes can exacerbate headaches.  Please ask your optometrist or eye physician to send the  records to us .  Given that you had a CT scan of the head recently, have a benign neurological exam and feel better, I do not see a pressing need to proceed with a brain MRI at this time but we can certainly consider it in the future if needed.   I will order a sleep study to look for signs of obstructive sleep apnea (aka OSA). As explained, the long-term risks and ramifications of untreated moderate to severe obstructive sleep apnea may include (but are not limited to): increased risk for cardiovascular disease, including congestive heart failure, stroke, difficult to control hypertension, treatment resistant obesity, arrhythmias, especially irregular heartbeat commonly known as A. Fib. (atrial fibrillation); even type 2 diabetes has been linked to untreated OSA. >>

## 2023-12-18 NOTE — Patient Instructions (Signed)
 It was nice to meet you today.   As discussed, your headaches are likely due to a combination of factors.   Here is what we discussed today and my recommendations for you:   Please remember, common headache triggers are: sleep deprivation, dehydration, overheating, stress, hypoglycemia or skipping meals and blood sugar fluctuations, excessive pain medications or excessive alcohol use or caffeine withdrawal. Some people have food triggers such as aged cheese, orange juice or chocolate, especially dark chocolate, or MSG (monosodium glutamate). Try to avoid these headache triggers as much possible. It may be helpful to keep a headache diary to figure out what makes your headaches worse or brings them on and what alleviates them. Some people report headache onset after exercise but studies have shown that regular exercise may actually prevent headaches from coming. If you have exercise-induced headaches, please make sure that you drink plenty of fluid before and after exercising and that you do not over do it and do not overheat. Please continue to stay well-hydrated and limit your caffeine as you are.   Please get an updated eye exam. You can seen any optometrist or ophthalmologist of your choosing. Strain on the eyes can exacerbate headaches.  Please ask your optometrist or eye physician to send the records to us .  Given that you had a CT scan of the head recently, have a benign neurological exam and feel better, I do not see a pressing need to proceed with a brain MRI at this time but we can certainly consider it in the future if needed.   I will order a sleep study to look for signs of obstructive sleep apnea (aka OSA). As explained, the long-term risks and ramifications of untreated moderate to severe obstructive sleep apnea may include (but are not limited to): increased risk for cardiovascular disease, including congestive heart failure, stroke, difficult to control hypertension, treatment resistant  obesity, arrhythmias, especially irregular heartbeat commonly known as A. Fib. (atrial fibrillation); even type 2 diabetes has been linked to untreated OSA.

## 2024-01-06 ENCOUNTER — Encounter: Payer: Self-pay | Admitting: Nurse Practitioner

## 2024-01-06 ENCOUNTER — Ambulatory Visit (INDEPENDENT_AMBULATORY_CARE_PROVIDER_SITE_OTHER): Payer: 59 | Admitting: Nurse Practitioner

## 2024-01-06 VITALS — BP 120/70 | HR 64 | Temp 98.9°F | Ht 73.0 in | Wt 237.6 lb

## 2024-01-06 DIAGNOSIS — E66811 Obesity, class 1: Secondary | ICD-10-CM

## 2024-01-06 DIAGNOSIS — Z23 Encounter for immunization: Secondary | ICD-10-CM | POA: Diagnosis not present

## 2024-01-06 DIAGNOSIS — Z13228 Encounter for screening for other metabolic disorders: Secondary | ICD-10-CM

## 2024-01-06 DIAGNOSIS — Z136 Encounter for screening for cardiovascular disorders: Secondary | ICD-10-CM

## 2024-01-06 DIAGNOSIS — E6609 Other obesity due to excess calories: Secondary | ICD-10-CM | POA: Diagnosis not present

## 2024-01-06 DIAGNOSIS — Z1159 Encounter for screening for other viral diseases: Secondary | ICD-10-CM

## 2024-01-06 DIAGNOSIS — Z13220 Encounter for screening for lipoid disorders: Secondary | ICD-10-CM

## 2024-01-06 DIAGNOSIS — Z139 Encounter for screening, unspecified: Secondary | ICD-10-CM

## 2024-01-06 DIAGNOSIS — Z6831 Body mass index (BMI) 31.0-31.9, adult: Secondary | ICD-10-CM

## 2024-01-06 DIAGNOSIS — Z0000 Encounter for general adult medical examination without abnormal findings: Secondary | ICD-10-CM

## 2024-01-06 DIAGNOSIS — Z2821 Immunization not carried out because of patient refusal: Secondary | ICD-10-CM

## 2024-01-06 DIAGNOSIS — Z125 Encounter for screening for malignant neoplasm of prostate: Secondary | ICD-10-CM

## 2024-01-06 NOTE — Progress Notes (Signed)
 LILLETTE Kristeen JINNY Gladis, CMA,acting as a neurosurgeon for Gaines Ada, FNP.,have documented all relevant documentation on the behalf of Gaines Ada, FNP,as directed by  Gaines Ada, FNP while in the presence of Gaines Ada, FNP.  Subjective:   Patient ID: Victor Ingram , male    DOB: Oct 19, 1970 , 53 y.o.   MRN: [id number]  Chief Complaint  Patient presents with   Annual Exam    Patient presents today for HM, Patient reports compliance with medication. Patient denies any chest pain, SOB, or headaches. Patient has no concerns today.      HPI  Discussed the use of AI scribe software for clinical note transcription with the patient, who gave verbal consent to proceed.  History of Present Illness Victor Ingram is a 53 year old male who presents for an annual physical exam.  He has been feeling well overall with no current health concerns. His previous headaches have resolved, and he is not taking any medication for them. He speculates that the headaches might have been related to a recent vaccine shot.  He recently underwent a colonoscopy, which revealed a small polyp. Prior to the procedure, he experienced some straining and bleeding and had a positive hemoccult test.  He maintains an active lifestyle, coaching middle school football and engaging in activities such as cutting grass and power washing, which he believes contributed to weight loss over the summer. He does not follow a specific diet.  No urinary issues. He received a tetanus shot recently. During the review of symptoms, he denies trouble swallowing, shortness of breath, chest pain, and swelling in his feet or ankles, although he noticed some swelling during the summer, likely due to heat and dehydration.   Past Medical History:  Diagnosis Date   Atypical chest pain 06/30/2019   Lower abdominal pain 10/10/2022   Recurrent headache 06/16/2023     Family History  Problem Relation Age of Onset   Arthritis Mother    Diabetes Father     Healthy Sister    Healthy Sister    Healthy Sister    Sleep apnea Brother    Healthy Brother    Healthy Brother    Stroke Maternal Grandmother    Congestive Heart Failure Maternal Grandfather    Heart failure Maternal Grandfather    Cancer Paternal Grandmother    Hearing loss Paternal Grandfather    Migraines Neg Hx    Headache Neg Hx      Current Outpatient Medications:    cetirizine (ZYRTEC) 10 MG tablet, Take 10 mg by mouth daily. (Patient taking differently: Take 10 mg by mouth as needed.), Disp: , Rfl:    fluticasone  (FLONASE ) 50 MCG/ACT nasal spray, Place 2 sprays into both nostrils daily. (Patient taking differently: Place 2 sprays into both nostrils as needed.), Disp: 16 g, Rfl: 0   Multiple Vitamins-Minerals (ONE-A-DAY 50 PLUS PO), Take by mouth., Disp: , Rfl:    Rimegepant Sulfate (NURTEC) 75 MG TBDP, Take 1 tablet (75 mg total) by mouth daily. (Patient taking differently: Take 1 tablet by mouth as needed.), Disp: 16 tablet, Rfl: 2  Current Facility-Administered Medications:    0.9 %  sodium chloride  infusion, 500 mL, Intravenous, Once, Danis, Victory CROME III, MD   No Known Allergies   Men's preventive visit. Patient Health Questionnaire (PHQ-2) is  Flowsheet Row Office Visit from 01/06/2024 in [facility]  PHQ-2 Total Score 0  Patient is on a Regular diet. Exercise: coaches Middle school football.  Marital status: Single. Relevant history for alcohol use is:  Social History   Substance and Sexual Activity  Alcohol Use No  . Relevant history for tobacco use is:  Social History   Tobacco Use  Smoking Status Never  Smokeless Tobacco Never   Review of Systems  Constitutional: Negative.   HENT: Negative.    Eyes: Negative.   Respiratory: Negative.    Cardiovascular: Negative.   Gastrointestinal: Negative.   Endocrine: Negative.   Genitourinary: Negative.   Musculoskeletal: Negative.   Skin: Negative.   Allergic/Immunologic:  Negative.   Neurological: Negative.   Hematological: Negative.   Psychiatric/Behavioral: Negative.       Today's Vitals   01/06/24 0958  BP: 120/70  Pulse: 64  Temp: 98.9 F (37.2 C)  TempSrc: Oral  Weight: 237 lb 9.6 oz (107.8 kg)  Height: 6' 1 (1.854 m)  PainSc: 0-No pain   Body mass index is 31.35 kg/m.  Wt Readings from Last 3 Encounters:  01/06/24 237 lb 9.6 oz (107.8 kg)  12/18/23 231 lb 9.6 oz (105.1 kg)  09/04/23 236 lb (107 kg)    Objective:  Physical Exam Vitals and nursing note reviewed.  Constitutional:      General: He is not in acute distress.    Appearance: Normal appearance. He is obese.  HENT:     Head: Normocephalic and atraumatic.     Right Ear: Tympanic membrane, ear canal and external ear normal. There is no impacted cerumen.     Left Ear: Tympanic membrane, ear canal and external ear normal. There is no impacted cerumen.  Cardiovascular:     Rate and Rhythm: Normal rate and regular rhythm.     Pulses: Normal pulses.     Heart sounds: Normal heart sounds. No murmur heard. Pulmonary:     Effort: Pulmonary effort is normal. No respiratory distress.     Breath sounds: Normal breath sounds. No wheezing.  Abdominal:     General: Abdomen is flat. Bowel sounds are normal. There is no distension.     Palpations: Abdomen is soft.  Genitourinary:    Prostate: Normal.     Rectum: Normal. Guaiac result negative.  Musculoskeletal:        General: Normal range of motion.     Cervical back: Normal range of motion and neck supple.  Skin:    General: Skin is warm and dry.     Capillary Refill: Capillary refill takes less than 2 seconds.  Neurological:     General: No focal deficit present.     Mental Status: He is alert and oriented to person, place, and time.     Cranial Nerves: No cranial nerve deficit.     Motor: No weakness.  Psychiatric:        Mood and Affect: Mood normal.        Behavior: Behavior normal.        Thought Content: Thought  content normal.        Judgment: Judgment normal.     Assessment And Plan:    Encounter for annual health examination Assessment & Plan: Routine wellness visit. Recent colonoscopy showed a small polyp. Headaches resolved. No urinary issues or family history of prostate cancer. Regular dental visits maintained. - Perform physical examination including prostate check. - Order lab tests including PSA level. - Discuss colonoscopy results and encourage continued hydration and fiber intake.  Discussed hepatitis B and C screening. Explained transmission routes and potential risks. - Offer hepatitis B vaccination if  desired. - Screen for hepatitis B and C immunity.  Orders: -     CBC with Differential/Platelet -     CMP14+EGFR  Influenza vaccination declined  Herpes zoster vaccination declined  Need for Tdap vaccination Assessment & Plan: Will give tetanus vaccine today while in office. Refer to order management. TDAP will be administered to adults 40-3 years old every 10 years.    Orders: -     Tdap vaccine greater than or equal to 7yo IM  Class 1 obesity due to excess calories without serious comorbidity with body mass index (BMI) of 31.0 to 31.9 in adult Assessment & Plan: Class 1 obesity with BMI 31.0 to 31.9. Engages in regular physical activity. Reports some weight loss during summer months. - Encourage continued physical activity.   Encounter for prostate cancer screening -     PSA  Encounter for hepatitis C screening test for low risk patient -     Hepatitis C antibody  Encounter for screening -     Hepatitis B surface antibody,qualitative  Encounter for lipid screening for cardiovascular disease -     Lipid panel  Encounter for screening for metabolic disorder -     Hemoglobin A1c    Return for 1 year physical. Patient was given opportunity to ask questions. Patient verbalized understanding of the plan and was able to repeat key elements of the plan. All  questions were answered to their satisfaction.   Gaines Ada, FNP  I, Gaines Ada, FNP, have reviewed all documentation for this visit. The documentation on 01/06/24 for the exam, diagnosis, procedures, and orders are all accurate and complete.

## 2024-01-06 NOTE — Addendum Note (Signed)
 Addended by: GEORGINA SPEAKS F on: 01/06/2024 11:51 AM   Modules accepted: Level of Service

## 2024-01-06 NOTE — Assessment & Plan Note (Signed)
 Will give tetanus vaccine today while in office. Refer to order management. TDAP will be administered to adults 44-52 years old every 10 years.

## 2024-01-06 NOTE — Assessment & Plan Note (Signed)
 Class 1 obesity with BMI 31.0 to 31.9. Engages in regular physical activity. Reports some weight loss during summer months. - Encourage continued physical activity.

## 2024-01-06 NOTE — Assessment & Plan Note (Addendum)
 Routine wellness visit. Recent colonoscopy showed a small polyp. Headaches resolved. No urinary issues or family history of prostate cancer. Regular dental visits maintained. - Perform physical examination including prostate check. - Order lab tests including PSA level. - Discuss colonoscopy results and encourage continued hydration and fiber intake.  Discussed hepatitis B and C screening. Explained transmission routes and potential risks. - Offer hepatitis B vaccination if desired. - Screen for hepatitis B and C immunity.

## 2024-01-08 ENCOUNTER — Other Ambulatory Visit

## 2024-01-09 LAB — CBC WITH DIFFERENTIAL/PLATELET
Basophils Absolute: 0.1 x10E3/uL (ref 0.0–0.2)
Basos: 2 %
EOS (ABSOLUTE): 0.1 x10E3/uL (ref 0.0–0.4)
Eos: 3 %
Hematocrit: 42 % (ref 37.5–51.0)
Hemoglobin: 13.8 g/dL (ref 13.0–17.7)
Immature Grans (Abs): 0 x10E3/uL (ref 0.0–0.1)
Immature Granulocytes: 0 %
Lymphocytes Absolute: 1.9 x10E3/uL (ref 0.7–3.1)
Lymphs: 36 %
MCH: 28.4 pg (ref 26.6–33.0)
MCHC: 32.9 g/dL (ref 31.5–35.7)
MCV: 86 fL (ref 79–97)
Monocytes Absolute: 0.5 x10E3/uL (ref 0.1–0.9)
Monocytes: 10 %
Neutrophils Absolute: 2.6 x10E3/uL (ref 1.4–7.0)
Neutrophils: 49 %
Platelets: 273 x10E3/uL (ref 150–450)
RBC: 4.86 x10E6/uL (ref 4.14–5.80)
RDW: 12.8 % (ref 11.6–15.4)
WBC: 5.2 x10E3/uL (ref 3.4–10.8)

## 2024-01-09 LAB — LIPID PANEL
Chol/HDL Ratio: 3.4 ratio (ref 0.0–5.0)
Cholesterol, Total: 172 mg/dL (ref 100–199)
HDL: 51 mg/dL (ref >39)
LDL Chol Calc (NIH): 106 mg/dL — ABNORMAL HIGH (ref 0–99)
Triglycerides: 78 mg/dL (ref 0–149)
VLDL Cholesterol Cal: 15 mg/dL (ref 5–40)

## 2024-01-09 LAB — HEPATITIS B SURFACE ANTIBODY,QUALITATIVE: Hep B Surface Ab, Qual: NONREACTIVE

## 2024-01-09 LAB — CMP14+EGFR
ALT: 13 IU/L (ref 0–44)
AST: 18 IU/L (ref 0–40)
Albumin: 4.2 g/dL (ref 3.8–4.9)
Alkaline Phosphatase: 59 IU/L (ref 47–123)
BUN/Creatinine Ratio: 10 (ref 9–20)
BUN: 11 mg/dL (ref 6–24)
Bilirubin Total: 0.5 mg/dL (ref 0.0–1.2)
CO2: 24 mmol/L (ref 20–29)
Calcium: 8.9 mg/dL (ref 8.7–10.2)
Chloride: 104 mmol/L (ref 96–106)
Creatinine, Ser: 1.15 mg/dL (ref 0.76–1.27)
Globulin, Total: 2.5 g/dL (ref 1.5–4.5)
Glucose: 94 mg/dL (ref 70–99)
Potassium: 4.5 mmol/L (ref 3.5–5.2)
Sodium: 140 mmol/L (ref 134–144)
Total Protein: 6.7 g/dL (ref 6.0–8.5)
eGFR: 77 mL/min/1.73 (ref >59)

## 2024-01-09 LAB — HEMOGLOBIN A1C
Est. average glucose Bld gHb Est-mCnc: 120 mg/dL
Hgb A1c MFr Bld: 5.8 % — ABNORMAL HIGH (ref 4.8–5.6)

## 2024-01-09 LAB — PSA: Prostate Specific Ag, Serum: 0.8 ng/mL (ref 0.0–4.0)

## 2024-01-09 LAB — HEPATITIS C ANTIBODY: Hep C Virus Ab: NONREACTIVE

## 2025-01-06 ENCOUNTER — Encounter: Payer: Self-pay | Admitting: Nurse Practitioner
